# Patient Record
Sex: Female | Born: 1965
Health system: Southern US, Community
[De-identification: ages and names within clinical notes are randomized; demographics above are authoritative.]

## PROBLEM LIST (undated history)

## (undated) DIAGNOSIS — R011 Cardiac murmur, unspecified: Secondary | ICD-10-CM

## (undated) DIAGNOSIS — E876 Hypokalemia: Secondary | ICD-10-CM

## (undated) DIAGNOSIS — E559 Vitamin D deficiency, unspecified: Secondary | ICD-10-CM

## (undated) DIAGNOSIS — E7800 Pure hypercholesterolemia, unspecified: Secondary | ICD-10-CM

## (undated) DIAGNOSIS — I10 Essential (primary) hypertension: Secondary | ICD-10-CM

## (undated) HISTORY — DX: Hypokalemia: E87.6

## (undated) HISTORY — DX: Essential (primary) hypertension: I10

## (undated) HISTORY — DX: Pure hypercholesterolemia, unspecified: E78.00

## (undated) HISTORY — DX: Cardiac murmur, unspecified: R01.1

## (undated) HISTORY — DX: Vitamin D deficiency, unspecified: E55.9

---

## 1998-07-03 HISTORY — PX: VAGINAL HYSTERECTOMY: SHX2639

## 2008-07-03 HISTORY — PX: KNEE SURGERY: SHX244

## 2008-07-29 ENCOUNTER — Ambulatory Visit: Payer: Self-pay | Admitting: Diagnostic Radiology

## 2008-07-29 ENCOUNTER — Emergency Department (HOSPITAL_BASED_OUTPATIENT_CLINIC_OR_DEPARTMENT_OTHER): Admission: EM | Admit: 2008-07-29 | Discharge: 2008-07-29 | Payer: Self-pay | Admitting: Emergency Medicine

## 2010-06-20 ENCOUNTER — Emergency Department (HOSPITAL_BASED_OUTPATIENT_CLINIC_OR_DEPARTMENT_OTHER)
Admission: EM | Admit: 2010-06-20 | Discharge: 2010-06-20 | Disposition: A | Discharge status: Other Acute Inpatient Hospital | Payer: Self-pay | Source: Home / Self Care | Admitting: Emergency Medicine

## 2010-06-20 ENCOUNTER — Inpatient Hospital Stay (HOSPITAL_COMMUNITY)
Admission: EM | Admit: 2010-06-20 | Discharge: 2010-06-21 | Payer: Self-pay | Attending: Internal Medicine | Admitting: Internal Medicine

## 2010-06-21 ENCOUNTER — Encounter (INDEPENDENT_AMBULATORY_CARE_PROVIDER_SITE_OTHER): Payer: Self-pay | Admitting: Internal Medicine

## 2010-09-12 LAB — LIPID PANEL
Cholesterol: 187 mg/dL (ref 0–200)
HDL: 50 mg/dL (ref >39)
Total CHOL/HDL Ratio: 3.7 RATIO
VLDL: 16 mg/dL (ref 0–40)

## 2010-09-12 LAB — CARDIAC PANEL(CRET KIN+CKTOT+MB+TROPI)
Relative Index: INVALID (ref 0.0–2.5)
Total CK: 75 U/L (ref 7–177)

## 2010-09-12 LAB — COMPREHENSIVE METABOLIC PANEL
BUN: 8 mg/dL (ref 6–23)
CO2: 27 mEq/L (ref 19–32)
Calcium: 8.5 mg/dL (ref 8.4–10.5)
Creatinine, Ser: 0.8 mg/dL (ref 0.4–1.2)
GFR calc Af Amer: >60 mL/min (ref >60)
GFR calc non Af Amer: >60 mL/min (ref >60)
Glucose, Bld: 95 mg/dL (ref 70–99)
Sodium: 139 mEq/L (ref 135–145)
Total Protein: 6.1 g/dL (ref 6.0–8.3)

## 2010-09-12 LAB — CBC
MCH: 27.5 pg (ref 26.0–34.0)
MCHC: 33.5 g/dL (ref 30.0–36.0)
MCV: 82 fL (ref 78.0–100.0)
MCV: 85.1 fL (ref 78.0–100.0)
Platelets: 312 10*3/uL (ref 150–400)
Platelets: 304 10*3/uL (ref 150–400)
RBC: 4.62 MIL/uL (ref 3.87–5.11)
RBC: 4.3 MIL/uL (ref 3.87–5.11)
RDW: 12.8 % (ref 11.5–15.5)
WBC: 8.2 10*3/uL (ref 4.0–10.5)

## 2010-09-12 LAB — HEMOGLOBIN A1C
Hgb A1c MFr Bld: 6 % — ABNORMAL HIGH (ref <5.7)
Mean Plasma Glucose: 126 mg/dL — ABNORMAL HIGH (ref <117)

## 2010-09-12 LAB — BASIC METABOLIC PANEL
BUN: 13 mg/dL (ref 6–23)
CO2: 27 mEq/L (ref 19–32)
Chloride: 106 mEq/L (ref 96–112)
Creatinine, Ser: 0.8 mg/dL (ref 0.4–1.2)
Glucose, Bld: 95 mg/dL (ref 70–99)

## 2010-09-12 LAB — DIFFERENTIAL
Eosinophils Absolute: 0.1 10*3/uL (ref 0.0–0.7)
Eosinophils Relative: 1 % (ref 0–5)
Lymphs Abs: 3.3 10*3/uL (ref 0.7–4.0)

## 2010-09-12 LAB — ANTI-NUCLEAR AB-TITER (ANA TITER): ANA Titer 1: 1:160 {titer} — ABNORMAL HIGH

## 2010-09-12 LAB — SEDIMENTATION RATE: Sed Rate: 4 mm/hr (ref 0–22)

## 2010-09-12 LAB — PROTIME-INR
INR: 1 (ref 0.00–1.49)
Prothrombin Time: 13.4 seconds (ref 11.6–15.2)

## 2010-10-17 LAB — DIFFERENTIAL
Basophils Absolute: 0 10*3/uL (ref 0.0–0.1)
Lymphocytes Relative: 32 % (ref 12–46)
Lymphs Abs: 2.3 10*3/uL (ref 0.7–4.0)
Monocytes Absolute: 0.4 10*3/uL (ref 0.1–1.0)
Neutro Abs: 4.5 10*3/uL (ref 1.7–7.7)

## 2010-10-17 LAB — BASIC METABOLIC PANEL
Calcium: 9.1 mg/dL (ref 8.4–10.5)
GFR calc Af Amer: >60 mL/min (ref >60)
GFR calc non Af Amer: >60 mL/min (ref >60)
Glucose, Bld: 95 mg/dL (ref 70–99)
Potassium: 3.5 mEq/L (ref 3.5–5.1)
Sodium: 139 mEq/L (ref 135–145)

## 2010-10-17 LAB — CBC
Hemoglobin: 11.7 g/dL — ABNORMAL LOW (ref 12.0–15.0)
RDW: 12.5 % (ref 11.5–15.5)
WBC: 7.4 10*3/uL (ref 4.0–10.5)

## 2010-10-17 LAB — POCT CARDIAC MARKERS
CKMB, poc: <1 ng/mL — ABNORMAL LOW (ref 1.0–8.0)
Myoglobin, poc: >500 ng/mL (ref 12–200)
Troponin i, poc: <0.05 ng/mL (ref 0.00–0.09)

## 2010-10-17 LAB — D-DIMER, QUANTITATIVE: D-Dimer, Quant: 0.59 ug/mL-FEU — ABNORMAL HIGH (ref 0.00–0.48)

## 2012-10-01 ENCOUNTER — Other Ambulatory Visit (HOSPITAL_COMMUNITY): Payer: Self-pay | Admitting: Internal Medicine

## 2012-10-01 ENCOUNTER — Ambulatory Visit (HOSPITAL_COMMUNITY)
Admission: RE | Admit: 2012-10-01 | Discharge: 2012-10-01 | Disposition: A | Discharge status: Home / Self Care | Payer: BC Managed Care – PPO | Source: Ambulatory Visit | Attending: Internal Medicine | Admitting: Internal Medicine

## 2012-10-01 DIAGNOSIS — G459 Transient cerebral ischemic attack, unspecified: Secondary | ICD-10-CM

## 2012-10-01 DIAGNOSIS — R4789 Other speech disturbances: Secondary | ICD-10-CM | POA: Insufficient documentation

## 2012-10-01 DIAGNOSIS — E236 Other disorders of pituitary gland: Secondary | ICD-10-CM | POA: Insufficient documentation

## 2012-10-01 DIAGNOSIS — R5381 Other malaise: Secondary | ICD-10-CM | POA: Insufficient documentation

## 2012-10-01 DIAGNOSIS — R5383 Other fatigue: Secondary | ICD-10-CM | POA: Insufficient documentation

## 2012-10-01 DIAGNOSIS — I10 Essential (primary) hypertension: Secondary | ICD-10-CM | POA: Insufficient documentation

## 2012-10-01 DIAGNOSIS — R209 Unspecified disturbances of skin sensation: Secondary | ICD-10-CM | POA: Insufficient documentation

## 2012-10-01 MED ORDER — GADOBENATE DIMEGLUMINE 529 MG/ML IV SOLN
19.0000 mL | Freq: Once | INTRAVENOUS | Status: AC | PRN
  Administered 2012-10-01: 19 mL via INTRAVENOUS

## 2012-10-11 ENCOUNTER — Encounter: Payer: Self-pay | Admitting: *Deleted

## 2012-10-11 ENCOUNTER — Ambulatory Visit: Payer: Self-pay | Admitting: Diagnostic Neuroimaging

## 2012-10-11 ENCOUNTER — Encounter: Payer: Self-pay | Admitting: Diagnostic Neuroimaging

## 2012-10-11 ENCOUNTER — Ambulatory Visit (INDEPENDENT_AMBULATORY_CARE_PROVIDER_SITE_OTHER): Payer: BC Managed Care – PPO | Admitting: Diagnostic Neuroimaging

## 2012-10-11 VITALS — BP 122/81 | HR 84 | Temp 97.8°F | Ht 67.0 in | Wt 207.0 lb

## 2012-10-11 DIAGNOSIS — I6350 Cerebral infarction due to unspecified occlusion or stenosis of unspecified cerebral artery: Secondary | ICD-10-CM

## 2012-10-11 DIAGNOSIS — I639 Cerebral infarction, unspecified: Secondary | ICD-10-CM | POA: Insufficient documentation

## 2012-10-11 DIAGNOSIS — R200 Anesthesia of skin: Secondary | ICD-10-CM

## 2012-10-11 DIAGNOSIS — R209 Unspecified disturbances of skin sensation: Secondary | ICD-10-CM

## 2012-10-11 NOTE — Patient Instructions (Signed)
Stroke (Cerebrovascular Accident) A stroke (cerebrovascular accident, CVA) means you have a brain injury from blocked circulation or bleeding in the brain. Blocked circulation usually comes from a clot. RISK FACTORS  High blood pressure (hypertension).   High cholesterol.   Diabetes.   Heart disease.   The buildup of fatty deposits in the blood vessels (peripheral artery disease or atherosclerosis).   An abnormal heart rhythm (atrial fibrillation).   Obesity.   Smoking.   Taking oral contraceptives (especially in combination with smoking).   Physical inactivity.   A diet high in fats, salt (sodium), and calories.   Alcohol use.   Use of illegal drugs (especially cocaine and methamphetamine).   Being a female.   Being an African American.   Age over 55.   Family history of stroke.   Previous history of blood clots, a "warning stroke" (transient ischemic attack, TIA), or heart attack.   Sickle cell disease.  SYMPTOMS  The symptoms of a stroke depend on the part of the brain that is affected. It is important to seek treatment within 4 hours of the start of symptoms because you may receive a "clot dissolving" medication that cannot be given after that time. Even if you don't know when your symptoms began, get treatment as soon as possible. Symptoms of a stroke may progress or change over the first several days. Symptoms may include:  Sudden weakness or numbness of the face, arm, or leg, especially on one side of the body.   Sudden confusion.   Trouble speaking (aphasia) or understanding.   Sudden trouble seeing in one or both eyes.   Sudden trouble walking.   Dizziness.   Loss of balance or coordination.   Sudden severe headache with no known cause.  HOME CARE INSTRUCTIONS   Medicines: Aspirin and blood thinners may be used to prevent another stroke. Blood thinners need to be used exactly as instructed. Medicines may also be used to control risk factors for a  stroke. Be sure you understand all your medicine instructions.   Diet: Certain diets may be prescribed to address high blood pressure, high cholesterol, diabetes, or obesity. A diet that includes 5 or more servings of fruits and vegetables a day may reduce the risk of stroke. Foods may need to be a special consistency (soft or pureed), or small bites may need to be taken in order to avoid aspirating or choking.   Maintain a healthy weight.   Stay physically active. It is recommended that you get at least 30 minutes of activity on most or all days.   Do not smoke.   Limit alcohol use.   Stop drug abuse.   Home safety: A safe home environment is important to reduce the risk of falls. Your caregiver may arrange for specialists to evaluate your home. Having grab bars in the bedroom and bathroom is often important. Your caregiver may arrange for special equipment to be used at home, such as raised toilets and a seat for the shower.   Physical, occupational, and speech therapy: Ongoing therapy may be needed to maximize your recovery after a stroke. If you have been advised to use a walker or a cane, use it at all times. Be sure to keep your therapy appointments.   Follow all instructions for follow-up with your caregiver. This is VERY important. This includes any referrals, physical therapy, rehabilitation, and laboratory tests. Proper treatment also prevents another stroke from occurring.  SEEK IMMEDIATE MEDICAL CARE IF:   You have   sudden weakness or numbness of the face, arm, or leg, especially on one side of the body.   You have sudden confusion.   You have trouble speaking (aphasia) or understanding.   You have sudden trouble seeing in one or both eyes.   You have sudden trouble walking.   You have dizziness.   You have loss of balance or coordination.   You have a sudden, severe headache with no known cause.   You have a fever.   You are coughing or have difficulty breathing.    You have new chest pain, angina, or an irregular heartbeat.  Any of these symptoms may represent a serious problem that is an emergency. Do not wait to see if the symptoms will go away. Get medical help at once. Call your local emergency services (911 in [place] Do not drive yourself to the hospital. Document Released: 06/19/2005 Document Revised: 01/02/2011 Document Reviewed: 11/17/2009 ExitCare Patient Information 2012 [employer]. 

## 2012-10-11 NOTE — Progress Notes (Signed)
[facility]  PATIENT: Krystal Jackson DOB: October 31, 1965  REFERRING CLINICIAN: [id number] HISTORY FROM: patient REASON FOR VISIT: new consult   HISTORICAL  CHIEF COMPLAINT:  Chief Complaint  Patient presents with  . Transient Ischemic Attack    HISTORY OF PRESENT ILLNESS:   47 year old right-handed female with hypertension, hypercholesteremia, here for evaluation of hospital TIA.  On 10/01/12 patient had sudden onset of left face and left arm and numbness. She had some weakness as well. Patient went to PCP who ordered MRI of the brain, which was normal. Patient symptoms persisted and slightly worsened on 10/04/12. Patient continues to have some numbness in her left face and left arm. She has intermittent episodes of feeling like her speech is slurred. She has some word finding difficulties. She also has had some left eyelid twitching for the past 2-3 weeks.  REVIEW OF SYSTEMS: Full 14 system review of systems performed and notable only for fatigue, rash trouble swallowing Iincreased thirst allergy runny nose headache numbness weakness.  ALLERGIES: No Known Allergies  HOME MEDICATIONS: No outpatient prescriptions prior to visit.   No facility-administered medications prior to visit.    PAST MEDICAL HISTORY: Past Medical History  Diagnosis Date  . Heart murmur   . Hypokalemia   . Vitamin D deficiency   . Hypertension   . Hypercholesteremia     PAST SURGICAL HISTORY: Past Surgical History  Procedure Laterality Date  . Knee surgery  2010  . Vaginal hysterectomy  2000    FAMILY HISTORY: Family History  Problem Relation Age of Onset  . Other Father   . Colon cancer    . Skin cancer    . Stroke    . High Cholesterol      SOCIAL HISTORY:  History   Social History  . Marital Status: Married    Spouse Name: N/A    Number of Children: 2  . Years of Education: MAED   Occupational History  . Teacher [employer]   Social History Main Topics  . Smoking status: Never Smoker   . Smokeless tobacco: Not on file  . Alcohol Use: No  . Drug Use: No  . Sexually Active: Not on file   Other Topics Concern  . Not on file   Social History Narrative      Pt lives at home with her spouse.   Caffeine Use- 16oz(s) daily     PHYSICAL EXAM  Filed Vitals:   10/11/12 1033  BP: 122/81  Pulse: 84  Temp: 97.8 F (36.6 C)  TempSrc: Oral  Height: 5\' 7"  (1.702 m)  Weight: 207 lb (93.895 kg)   Body mass index is 32.41 kg/(m^2).  GENERAL EXAM: Patient is in no distress  CARDIOVASCULAR: Regular rate and rhythm, no murmurs, no carotid bruits  NEUROLOGIC: MENTAL STATUS: awake, alert, language fluent, comprehension intact, naming intact CRANIAL NERVE: no papilledema on fundoscopic exam, pupils equal and reactive to light, visual fields full to confrontation, extraocular muscles intact, no nystagmus, DECR SENS IN LEFT FACE, NORMAL FACIAL STRENGTH, uvula midline, shoulder shrug symmetric, TONGUE DEVIATES TO THE RIGHT, BUT ABLE TO MOVE TO THE LEFT VOLUNTARILY. MOTOR: normal bulk and tone, full strength in the RUE, BLE; LEFT ARM DELTOID 4+, LEFT TRICEPS 3, LEFT GRIP 4. SENSORY: DECR IN LEFT ARM COORDINATION: finger-nose-finger, fine finger movements normal REFLEXES: deep tendon reflexes present and symmetric GAIT/STATION: narrow based gait; able to walk on toes, heels and tandem; romberg is negative  DIAGNOSTIC DATA (LABS, IMAGING, TESTING) - I reviewed patient records, labs, notes, testing and imaging myself where available.  Lab Results  Component Value Date   WBC 8.2 06/21/2010   HGB 12.1 06/21/2010   HCT 36.6 06/21/2010   MCV 85.1 06/21/2010   PLT 304 06/21/2010      Component Value Date/Time   NA 139 06/21/2010 0450   K 3.1* 06/21/2010 0450   CL 105 06/21/2010 0450   CO2 27 06/21/2010 0450   GLUCOSE 95 06/21/2010 0450   BUN 8 06/21/2010 0450   CREATININE 0.80 06/21/2010  0450   CALCIUM 8.5 06/21/2010 0450   PROT 6.1 06/21/2010 0450   ALBUMIN 3.4* 06/21/2010 0450   AST 15 06/21/2010 0450   ALT 12 06/21/2010 0450   ALKPHOS 80 06/21/2010 0450   BILITOT 0.7 06/21/2010 0450   GFRNONAA >60 06/21/2010 0450   GFRAA  Value: >60        The eGFR has been calculated using the MDRD equation. This calculation has not been validated in all clinical situations. eGFR's persistently <60 mL/min signify possible Chronic Kidney Disease. 06/21/2010 0450   Lab Results  Component Value Date   CHOL  Value: 187        ATP III CLASSIFICATION:  <200     mg/dL   Desirable  981-191  mg/dL   Borderline High  >=478    mg/dL   High        29/56/2130   HDL 50 06/21/2010   LDLCALC  Value: 121        Total Cholesterol/HDL:CHD Risk Coronary Heart Disease Risk Table                     Men   Women  1/2 Average Risk   3.4   3.3  Average Risk       5.0   4.4  2 X Average Risk   9.6   7.1  3 X Average Risk  23.4   11.0        Use the calculated Patient Ratio above and the CHD Risk Table to determine the patient's CHD Risk.        ATP III CLASSIFICATION (LDL):  <100     mg/dL   Optimal  865-784  mg/dL   Near or Above                    Optimal  130-159  mg/dL   Borderline  696-295  mg/dL   High  >284     mg/dL   Very High* 13/24/4010   TRIG 78 06/21/2010   CHOLHDL 3.7 06/21/2010   Lab Results  Component Value Date   HGBA1C  Value: 6.0 (NOTE)                                                                       According to the ADA Clinical Practice Recommendations for 2011, when HbA1c is used as a screening test:   >=6.5%   Diagnostic of Diabetes Mellitus           (if abnormal result  is confirmed)  5.7-6.4%   Increased risk of developing Diabetes Mellitus  References:Diagnosis and Classification of Diabetes Mellitus,Diabetes Care,2011,34(Suppl  1):S62-S69 and Standards of Medical Care in         Diabetes - 2011,Diabetes [phone]  (Suppl 1):S11-S61.* 06/21/2010   No results found for this  basename: [id number]   Lab Results  Component Value Date   TSH 2.866 06/21/2010   10/01/12 MRI brain - no acute findings; normal  ASSESSMENT AND PLAN  47 y.o. year old female  has a past medical history of Heart murmur; Hypokalemia; Vitamin D deficiency; Hypertension; and Hypercholesteremia. here with persistent left face and left arm numbness with left arm weakness. Initial MRI of the brain was negative for acute stroke. However patient's clinical symptoms may suggest MRI negative stroke. Sometimes MRI study done immediately after onset of stroke may be negative in cases of small vessel thrombosis affecting the brain stem particularly. Due to persistence of symptoms with some fluctuation worsening since she had her previous scan, I will repeat MRI of the brain. This time I will also add MRA of the head and neck to evaluate for other causes of stroke. I will order echocardiogram. Patient should continue on aspirin and her blood pressure medication. She's taking Crestor. Stroke risk factors and warning symptoms reviewed with patient.   Orders Placed This Encounter  Procedures  . MR Brain Wo Contrast  . MR MRA HEAD WO CONTRAST  . MR Angiogram Neck W Wo Contrast  . 2D Echocardiogram without contrast    Suanne Marker, MD 10/11/2012, 2:59 PM Certified in Neurology, Neurophysiology and Neuroimaging  [facility] [address] [place], [place] [address] [phone]

## 2012-10-17 ENCOUNTER — Ambulatory Visit (INDEPENDENT_AMBULATORY_CARE_PROVIDER_SITE_OTHER): Payer: BC Managed Care – PPO

## 2012-10-17 ENCOUNTER — Other Ambulatory Visit: Payer: BC Managed Care – PPO

## 2012-10-17 DIAGNOSIS — R209 Unspecified disturbances of skin sensation: Secondary | ICD-10-CM

## 2012-10-17 DIAGNOSIS — I639 Cerebral infarction, unspecified: Secondary | ICD-10-CM

## 2012-10-17 DIAGNOSIS — I6350 Cerebral infarction due to unspecified occlusion or stenosis of unspecified cerebral artery: Secondary | ICD-10-CM

## 2012-10-17 DIAGNOSIS — R200 Anesthesia of skin: Secondary | ICD-10-CM

## 2012-10-17 MED ORDER — GADOPENTETATE DIMEGLUMINE 469.01 MG/ML IV SOLN: 20.0000 mL | Freq: Once | INTRAVENOUS | Status: AC | PRN

## 2012-10-23 ENCOUNTER — Telehealth: Payer: Self-pay | Admitting: *Deleted

## 2012-10-23 NOTE — Telephone Encounter (Signed)
Message copied by Monico Blitz on Wed Oct 23, 2012 12:17 PM ------      Message from: [place], [place] F      Created: Wed Oct 23, 2012 11:41 AM       Patient needs a call back about test. ------

## 2012-10-23 NOTE — Telephone Encounter (Signed)
Patient had a MR Angiogram W Wo which was normal, MR MRA head which was normal,brain scan normal. Once the patient calls back i will relay this once she calls back.

## 2012-10-25 ENCOUNTER — Telehealth: Payer: Self-pay | Admitting: *Deleted

## 2012-10-25 NOTE — Telephone Encounter (Signed)
Called patient and relayed information

## 2012-11-04 ENCOUNTER — Other Ambulatory Visit (HOSPITAL_COMMUNITY): Payer: BC Managed Care – PPO

## 2012-11-08 ENCOUNTER — Ambulatory Visit (HOSPITAL_COMMUNITY): Payer: BC Managed Care – PPO | Attending: Diagnostic Neuroimaging | Admitting: Radiology

## 2012-11-08 ENCOUNTER — Other Ambulatory Visit (HOSPITAL_COMMUNITY): Payer: Self-pay | Admitting: Radiology

## 2012-11-08 DIAGNOSIS — Z8673 Personal history of transient ischemic attack (TIA), and cerebral infarction without residual deficits: Secondary | ICD-10-CM | POA: Insufficient documentation

## 2012-11-08 DIAGNOSIS — R209 Unspecified disturbances of skin sensation: Secondary | ICD-10-CM | POA: Insufficient documentation

## 2012-11-08 DIAGNOSIS — R200 Anesthesia of skin: Secondary | ICD-10-CM

## 2012-11-08 DIAGNOSIS — G459 Transient cerebral ischemic attack, unspecified: Secondary | ICD-10-CM

## 2012-11-08 DIAGNOSIS — I639 Cerebral infarction, unspecified: Secondary | ICD-10-CM

## 2012-11-08 DIAGNOSIS — I6789 Other cerebrovascular disease: Secondary | ICD-10-CM

## 2012-11-08 NOTE — Progress Notes (Signed)
Echocardiogram performed.  

## 2012-11-15 ENCOUNTER — Telehealth: Payer: Self-pay | Admitting: Diagnostic Neuroimaging

## 2012-11-18 NOTE — Telephone Encounter (Signed)
Showing pt called in on 11/15/12 to get Echo results. Per notes results were given on 11/11/12. Called pt to confirm, no answer.

## 2012-12-23 ENCOUNTER — Telehealth: Payer: Self-pay

## 2012-12-23 NOTE — Telephone Encounter (Signed)
PCP requested copy of echo results faxed. Sent info to fax# [phone]

## 2013-03-27 ENCOUNTER — Other Ambulatory Visit (HOSPITAL_BASED_OUTPATIENT_CLINIC_OR_DEPARTMENT_OTHER): Payer: Self-pay | Admitting: Internal Medicine

## 2013-03-27 DIAGNOSIS — R160 Hepatomegaly, not elsewhere classified: Secondary | ICD-10-CM

## 2013-03-27 DIAGNOSIS — R109 Unspecified abdominal pain: Secondary | ICD-10-CM

## 2013-04-09 ENCOUNTER — Ambulatory Visit (HOSPITAL_BASED_OUTPATIENT_CLINIC_OR_DEPARTMENT_OTHER)
Admission: RE | Admit: 2013-04-09 | Discharge: 2013-04-09 | Disposition: A | Discharge status: Home / Self Care | Payer: BC Managed Care – PPO | Source: Ambulatory Visit | Attending: Internal Medicine | Admitting: Internal Medicine

## 2013-04-09 DIAGNOSIS — K7689 Other specified diseases of liver: Secondary | ICD-10-CM | POA: Insufficient documentation

## 2013-04-09 DIAGNOSIS — R1011 Right upper quadrant pain: Secondary | ICD-10-CM | POA: Insufficient documentation

## 2013-04-09 DIAGNOSIS — R109 Unspecified abdominal pain: Secondary | ICD-10-CM

## 2013-04-09 DIAGNOSIS — K8689 Other specified diseases of pancreas: Secondary | ICD-10-CM | POA: Insufficient documentation

## 2013-04-09 DIAGNOSIS — R160 Hepatomegaly, not elsewhere classified: Secondary | ICD-10-CM

## 2013-04-30 ENCOUNTER — Encounter: Payer: Self-pay | Admitting: Diagnostic Neuroimaging

## 2013-04-30 ENCOUNTER — Ambulatory Visit (INDEPENDENT_AMBULATORY_CARE_PROVIDER_SITE_OTHER): Payer: BC Managed Care – PPO | Admitting: Diagnostic Neuroimaging

## 2013-04-30 VITALS — BP 112/63 | HR 85 | Temp 97.7°F | Ht 67.0 in | Wt 216.0 lb

## 2013-04-30 DIAGNOSIS — R200 Anesthesia of skin: Secondary | ICD-10-CM | POA: Insufficient documentation

## 2013-04-30 DIAGNOSIS — R209 Unspecified disturbances of skin sensation: Secondary | ICD-10-CM

## 2013-04-30 NOTE — Progress Notes (Signed)
[facility]  PATIENT: Krystal Jackson DOB: [birth year]  REFERRING CLINICIAN:  HISTORY FROM: patient REASON FOR VISIT: follow up   HISTORICAL  CHIEF COMPLAINT:  Chief Complaint  Patient presents with  . Follow-up    HISTORY OF PRESENT ILLNESS:   UPDATE 04/30/13: Since last visit patient doing well. No recurrent symptoms. Patient told me that she was diagnosed with possible neuropathy in the feet, related to long-standing intermittent bilateral lateral foot numbness and tingling. Patient is taking gabapentin with mild relief. Overall cholesterol and blood pressure under good control. Patient is concerned about continuing on hormone replacement therapy (Femring) and has asked me to contact her OB/GYN to further discuss. Patient also tells me that her sister recently passed away from heart related complications related to a "blood clot in the heart". Patient tells me that she herself has been having intermittent chest pressure sensation and exertional chest pain, and is being worked up by primary care physician currently. She has not seen cardiology.  PRIOR HPI (10/11/12): 47 year old right-handed female with hypertension, hypercholesteremia, here for evaluation of hospital TIA.  On 10/01/12 patient had sudden onset of left face and left arm and numbness. She had some weakness as well. Patient went to PCP who ordered MRI of the brain, which was normal. Patient symptoms persisted and slightly worsened on 10/04/12. Patient continues to have some numbness in her left face and left arm. She has intermittent episodes of feeling like her speech is slurred. She has some word finding difficulties. She also has had some left eyelid twitching for the past 2-3 weeks.  REVIEW OF SYSTEMS: Full 14 system review of systems performed and notable only for chest pain and swelling in legs joint pain joint swelling.   ALLERGIES: Allergies  Allergen Reactions  . Gabapentin Hives     HOME MEDICATIONS: Outpatient Prescriptions Prior to Visit  Medication Sig Dispense Refill  . aspirin EC 325 MG tablet Take 1 tablet by mouth daily.      Marland Kitchen FEMRING 0.1 MG/24HR RING daily.      . hydrochlorothiazide (HYDRODIURIL) 25 MG tablet daily.      Marland Kitchen KLOR-CON 10 10 MEQ tablet Take 2 tablets by mouth daily.      . rosuvastatin (CRESTOR) 10 MG tablet Take 10 mg by mouth daily.      . Vitamin D, Ergocalciferol, (DRISDOL) 50000 UNITS CAPS Take 50,000 Units by mouth 2 (two) times a week.       . diltiazem (DILACOR XR) 120 MG 24 hr capsule Take 1 capsule by mouth every 4 (four) hours as needed.       No facility-administered medications prior to visit.    PAST MEDICAL HISTORY: Past Medical History  Diagnosis Date  . Heart murmur   . Hypokalemia   . Vitamin D deficiency   . Hypertension   . Hypercholesteremia     PAST SURGICAL HISTORY: Past Surgical History  Procedure Laterality Date  . Knee surgery  2010  . Vaginal hysterectomy  2000    FAMILY HISTORY: Family History  Problem Relation Age of Onset  . Other Father   . Colon cancer    . Skin cancer    . Stroke    . High Cholesterol      SOCIAL HISTORY:  History   Social History  . Marital Status: Married    Spouse Name: N/A    Number of Children: 2  . Years of Education: MAED   Occupational History  . Teacher [employer]   Social History Main Topics  . Smoking status: Never Smoker   . Smokeless tobacco: Not on file  . Alcohol Use: No  . Drug Use: No  . Sexual Activity: Not on file   Other Topics Concern  . Not on file   Social History Narrative      Pt lives at home with her spouse.   Caffeine Use- 16oz(s) daily     PHYSICAL EXAM  Filed Vitals:   04/30/13 1448  BP: 112/63  Pulse: 85  Temp: 97.7 F (36.5 C)  TempSrc: Oral  Height: 5\' 7"  (1.702 m)  Weight: 216 lb (97.977 kg)   Body mass index is 33.82 kg/(m^2).  GENERAL EXAM: Patient is in no  distress  CARDIOVASCULAR: Regular rate and rhythm, no murmurs, no carotid bruits  NEUROLOGIC: MENTAL STATUS: awake, alert, language fluent, comprehension intact, naming intact CRANIAL NERVE: pupils equal and reactive to light, visual fields full to confrontation, extraocular muscles intact, no nystagmus, facial sensation and strength symmetric, uvula midline, shoulder shrug symmetric, tongue midline. MOTOR: normal bulk and tone, full strength in the BUE, BLE SENSORY: normal and symmetric to light touch, temperature, vibration COORDINATION: finger-nose-finger, fine finger movements normal REFLEXES: deep tendon reflexes present and symmetric GAIT/STATION: narrow based gait; romberg is negative    DIAGNOSTIC DATA (LABS, IMAGING, TESTING) - I reviewed patient records, labs, notes, testing and imaging myself where available.  Lab Results  Component Value Date   WBC 8.2 06/21/2010   HGB 12.1 06/21/2010   HCT 36.6 06/21/2010   MCV 85.1 06/21/2010   PLT 304 06/21/2010      Component Value Date/Time   NA 139 06/21/2010 0450   K 3.1* 06/21/2010 0450   CL 105 06/21/2010 0450   CO2 27 06/21/2010 0450   GLUCOSE 95 06/21/2010 0450   BUN 8 06/21/2010 0450   CREATININE 0.80 06/21/2010 0450   CALCIUM 8.5 06/21/2010 0450   PROT 6.1 06/21/2010 0450   ALBUMIN 3.4* 06/21/2010 0450   AST 15 06/21/2010 0450   ALT 12 06/21/2010 0450   ALKPHOS 80 06/21/2010 0450   BILITOT 0.7 06/21/2010 0450   GFRNONAA >60 06/21/2010 0450   GFRAA  Value: >60        The eGFR has been calculated using the MDRD equation. This calculation has not been validated in all clinical situations. eGFR's persistently <60 mL/min signify possible Chronic Kidney Disease. 06/21/2010 0450   Lab Results  Component Value Date   CHOL  Value: 187        ATP III CLASSIFICATION:  <200     mg/dL   Desirable  782-956  mg/dL   Borderline High  >=213    mg/dL   High        08/65/7846   HDL 50 06/21/2010   LDLCALC  Value: 121         Total Cholesterol/HDL:CHD Risk Coronary Heart Disease Risk Table                     Men   Women  1/2 Average Risk   3.4   3.3  Average Risk       5.0   4.4  2 X Average Risk   9.6   7.1  3 X Average Risk  23.4   11.0        Use the calculated Patient Ratio above and the CHD Risk Table to determine the patient's CHD Risk.  ATP III CLASSIFICATION (LDL):  <100     mg/dL   Optimal  161-096  mg/dL   Near or Above                    Optimal  130-159  mg/dL   Borderline  045-409  mg/dL   High  >811     mg/dL   Very High* 91/47/8295   TRIG 78 06/21/2010   CHOLHDL 3.7 06/21/2010   Lab Results  Component Value Date   HGBA1C  Value: 6.0 (NOTE)                                                                       According to the ADA Clinical Practice Recommendations for 2011, when HbA1c is used as a screening test:   >=6.5%   Diagnostic of Diabetes Mellitus           (if abnormal result  is confirmed)  5.7-6.4%   Increased risk of developing Diabetes Mellitus  References:Diagnosis and Classification of Diabetes Mellitus,Diabetes Care,2011,34(Suppl 1):S62-S69 and Standards of Medical Care in         Diabetes - 2011,Diabetes Care,2011,34  (Suppl 1):S11-S61.* 06/21/2010   No results found for this basename: [id number]   Lab Results  Component Value Date   TSH 2.866 06/21/2010   10/01/12 MRI brain - no acute findings; normal  ASSESSMENT AND PLAN  47 y.o. year old female  has a past medical history of Heart murmur; Hypokalemia; Vitamin D deficiency; Hypertension; and Hypercholesteremia. here with episode of persistent left face and left arm numbness with left arm weakness. Initial MRI of the brain was negative for acute stroke. Follow up MRI brain, MRA head/neck normal.  It is unclear if patient truly had a TIA or MRI negative stroke.  Ddx: TIA, MRI negative stroke, stress reaction, complicated acephalgic migraine  PLAN: 1. Continue aspirin, blood pressure control, crestor 2. I called Dr. Jethro Poling  (OB/GYN) about her hormone replacement. I think it is reasonable for patient to continue on estrogen replacement therapy, recognizing that even though there may be a slight increased risk for stroke, in balance with the patient's severe hormone deficiency symptoms, it is probably worthwhile for patient to continue on her current therapy (Femring).  Return in about 1 year (around 04/30/2014) for with Heide Guile or Eisa Necaise.   Suanne Marker, MD 04/30/2013, 3:44 PM Certified in Neurology, Neurophysiology and Neuroimaging  [facility] [address], [address] [place], [place] [address] [phone]

## 2013-05-08 ENCOUNTER — Other Ambulatory Visit: Payer: Self-pay

## 2013-07-18 ENCOUNTER — Ambulatory Visit (INDEPENDENT_AMBULATORY_CARE_PROVIDER_SITE_OTHER): Payer: BC Managed Care – PPO | Admitting: Neurology

## 2013-07-18 ENCOUNTER — Encounter: Payer: Self-pay | Admitting: Neurology

## 2013-07-18 VITALS — BP 120/80 | HR 72 | Ht 67.5 in | Wt 216.0 lb

## 2013-07-18 DIAGNOSIS — R0902 Hypoxemia: Secondary | ICD-10-CM

## 2013-07-18 DIAGNOSIS — G4761 Periodic limb movement disorder: Secondary | ICD-10-CM

## 2013-07-18 DIAGNOSIS — G2581 Restless legs syndrome: Secondary | ICD-10-CM

## 2013-07-18 DIAGNOSIS — G4733 Obstructive sleep apnea (adult) (pediatric): Secondary | ICD-10-CM

## 2013-07-18 NOTE — Progress Notes (Signed)
Subjective:    Patient ID: Krystal Jackson is a 48 y.o. female.  HPI  Huston Foley, MD, PhD [facility] [address] [address] [place], [place] [address]   Dear Krystal Jackson,   I saw your patient, Krystal Jackson, upon your kind request in my neurologic clinic today for initial consultation of her sleep disorder, concern for obstructive sleep apnea in the context of her recent abnormal overnight pulse oximetry results. The patient is unaccompanied today. As you know, Krystal Jackson, is a very pleasant 48 year old right-handed woman with an underlying medical history of hyperlipidemia, hyperglycemia, atypical chest pain palpitations, and history of TIA for which she has recently seen Dr. Marjory Lies in our office in 10/14 and workup for TIA in April of 2014, who had a nocturnal oximetry test on 06/11/2013 which showed a low oxygen nadir of 75% and 48 minutes below the saturation of 88%. Given her obesity there is concerned that she may have underlying obstructive sleep apnea. She does endorse daytime somnolence. She had cardiac workup including event monitor, stress test and echocardiogram which were reassuring. She has a FHx of heart disease and her sister recently died from heart failure at the age of 40 and her father died at the age of 59 from a PE.  She has been having palpitations for the past few years, but this got worse in October 2014, but she also had a lot of stress at the time, in particular with her sister's health decline and demise. She has palpitations often at night, and she has woken herself up from sleep with heart pounding and irregular pounding.   Her typical bedtime is reported to be around 11 PM and usual wake time is around 8:30 AM. Sleep onset typically occurs within 30 minutes to 2-3 hours at times. She reports feeling poorly rested upon awakening. She wakes up on an average 2 times in the middle of the night and has to go to the bathroom  2 times on a typical night. She admits to frequent morning headaches.  She works full time as a Runner, broadcasting/film/video at [employer], teaching History.  She reports excessive daytime somnolence (EDS) and Her Epworth Sleepiness Score (ESS) is 10/24 today. She has not fallen asleep while driving. The patient has not been taking a planned nap during the work week, but likes to take long nap on weekends.  She has been known to snore for the past few years. Snoring is reportedly marked, and associated with choking sounds and witnessed apneas. The patient admits to a sense of choking or strangling feeling and has woken up with gasping. There is report of nighttime reflux, with no nighttime cough experienced. The patient has noted any RLS symptoms and is known to kick while asleep or before falling asleep. There is no family history of RLS or OSA.  She is a restless sleeper and in the morning, the bed is quite disheveled.   She denies cataplexy, sleep paralysis, hypnagogic or hypnopompic hallucinations, or sleep attacks. She does not report any vivid dreams, nightmares, dream enactments, or parasomnias, such as sleep talking or sleep walking. The patient has not had a sleep study or a home sleep test.  She consumes 3 caffeinated beverages per day, usually in the form of tea.  Her bedroom is usually dark and cool. There is a TV in the bedroom and usually it is not on at night.   Her Past Medical History Is Significant For: Past Medical History  Diagnosis Date  .  Heart murmur   . Hypokalemia   . Vitamin D deficiency   . Hypertension   . Hypercholesteremia     Her Past Surgical History Is Significant For: Past Surgical History  Procedure Laterality Date  . Knee surgery  2010  . Vaginal hysterectomy  2000    Her Family History Is Significant For: Family History  Problem Relation Age of Onset  . Other Father   . Colon cancer    . Skin cancer    . Stroke    . High Cholesterol      Her Social History Is Significant  For: History   Social History  . Marital Status: Married    Spouse Name: Krystal Jackson    Number of Children: 2  . Years of Education: MAED   Occupational History  . 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 Smoking status: Never Smoker   . Smokeless tobacco: Never Used  . Alcohol Use: No  . Drug Use: No  . Sexual Activity: None   Other Topics Concern  . None   Social History Narrative      Pt lives at home with her spouse Krystal Jackson) and her daughter.   Patient has two children.   Caffeine Use- 16oz(s) daily   Patient has a Masters degree.   Patient is right- handed.   Patient drinks 16 oz. Of caffeine daily.    Her Allergies Are:  Allergies  Allergen Reactions  . Gabapentin Hives  :   Her Current Medications Are:  Outpatient Encounter Prescriptions as of 07/18/2013  Medication Sig  . aspirin EC 325 MG tablet Take 1 tablet by mouth daily.  Marland Kitchen FEMRING 0.1 MG/24HR RING daily.  . hydrochlorothiazide (HYDRODIURIL) 25 MG tablet daily.  Marland Kitchen KLOR-CON 10 10 MEQ tablet Take 2 tablets by mouth daily.  . naproxen (NAPROSYN) 500 MG tablet Take 1 tablet by mouth as needed.  . rosuvastatin (CRESTOR) 10 MG tablet Take 10 mg by mouth daily.  . Vitamin D, Ergocalciferol, (DRISDOL) 50000 UNITS CAPS Take 50,000 Units by mouth 2 (two) times a week.   . diltiazem (DILACOR XR) 120 MG 24 hr capsule Take 1 capsule by mouth every 4 (four) hours as needed.   Review of Systems:  Out of a complete 14 point review of systems, all are reviewed and negative with the exception of these symptoms as listed below: Review of Systems  Constitutional: Positive for fatigue.  Eyes: Negative.   Respiratory: Negative.   Cardiovascular: Positive for palpitations and leg swelling.  Gastrointestinal: Negative.   Endocrine: Negative.   Genitourinary: Negative.   Musculoskeletal: Positive for arthralgias.  Skin: Negative.   Allergic/Immunologic:  Negative.   Neurological: Positive for headaches.  Hematological: Negative.   Psychiatric/Behavioral: Positive for sleep disturbance (snoring).    Objective:  Neurologic Exam  Physical Exam Physical Examination:   Filed Vitals:   07/18/13 0913  BP: 120/80  Pulse: 72   General Examination: The patient is a very pleasant 48 y.o. female in no acute distress. She appears well-developed and well-nourished and well groomed.   HEENT: Normocephalic, atraumatic, pupils are equal, round and reactive to light and accommodation. Funduscopic exam is normal with sharp disc margins noted. Extraocular tracking is good without limitation to gaze excursion or nystagmus noted. Normal smooth pursuit is noted. Hearing is grossly intact. Tympanic membranes are clear bilaterally. Face is symmetric with normal facial animation and normal facial  sensation. Speech is clear with no dysarthria noted. There is no hypophonia. There is no lip, neck/head, jaw or voice tremor. Neck is supple with full range of passive and active motion. There are no carotid bruits on auscultation. Oropharynx exam reveals: mild mouth dryness, good dental hygiene and moderate airway crowding, due to larger tongue and large and elongated uvula. Mallampati is class II. Tongue protrudes centrally and palate elevates symmetrically. Tonsils are 1+ in size. Neck size is 16.5 inches. She has a tiny overbite. Nasal inspection reveals no significant nasal mucosal bogginess, but redness is noted, and no septal deviation.     Chest: Clear to auscultation without wheezing, rhonchi or crackles noted.  Heart: S1+S2+0, regular and normal without murmurs, rubs or gallops noted.   Abdomen: Soft, non-tender and non-distended with normal bowel sounds appreciated on auscultation.  Extremities: There is no pitting edema in the distal lower extremities bilaterally. Pedal pulses are intact.  Skin: Warm and dry without trophic changes noted. There are no  varicose veins.  Musculoskeletal: exam reveals no obvious joint deformities, tenderness or joint swelling or erythema.   Neurologically:  Mental status: The patient is awake, alert and oriented in all 4 spheres. Her memory, attention, language and knowledge are appropriate. There is no aphasia, agnosia, apraxia or anomia. Speech is clear with normal prosody and enunciation. Thought process is linear. Mood is congruent and affect is normal.  Cranial nerves are as described above under HEENT exam. In addition, shoulder shrug is normal with equal shoulder height noted. Motor exam: Normal bulk, strength and tone is noted. There is no drift, tremor or rebound. Romberg is negative. Reflexes are 2+ throughout. Toes are downgoing bilaterally. Fine motor skills are intact with normal finger taps, normal hand movements, normal rapid alternating patting, normal foot taps and normal foot agility.  Cerebellar testing shows no dysmetria or intention tremor on finger to nose testing. Heel to shin is unremarkable bilaterally. There is no truncal or gait ataxia.  Sensory exam is intact to light touch, pinprick, vibration, temperature sense in the upper and lower extremities.  Gait, station and balance are unremarkable. No veering to one side is noted. No leaning to one side is noted. Posture is age-appropriate and stance is narrow based. No problems turning are noted. She turns en bloc. Tandem walk is unremarkable. Intact toe and heel stance is noted.               Assessment and Plan:   In summary, Krystal Jackson is a very pleasant 48 y.o.-year old female with a history and physical exam concerning for obstructive sleep apnea (OSA). I had a long chat with the patient about my findings and the diagnosis, its prognosis and treatment options. We talked about medical treatments and non-pharmacological approaches. I explained in particular the risks and ramifications of untreated moderate to severe OSA, especially  with respect to developing cardiovascular disease down the Road, including congestive heart failure, difficult to treat hypertension, cardiac arrhythmias, or stroke. Even type 2 diabetes has in part been linked to untreated OSA. We talked about trying to maintain a healthy lifestyle in general, as well as the importance of weight control. I encouraged the patient to eat healthy, exercise daily and keep well hydrated, to keep a scheduled bedtime and wake time routine, to not skip any meals and eat healthy snacks in between meals.  I recommended the following at this time: sleep study with potential positive airway pressure titration.  I explained the sleep test  procedure to the patient and also outlined possible surgical and non-surgical treatment options of OSA, including the use of a custom-made dental device, upper airway surgical options, such as pillar implants, radiofrequency surgery, tongue base surgery, and UPPP. I also explained the CPAP treatment option to the patient, who indicated that she would be willing to try CPAP if the need arises. I explained the importance of being compliant with PAP treatment, not only for insurance purposes but primarily to improve Her symptoms, and for the patient's long term health benefit, including to reduce Her cardiovascular risks. I answered all her questions today and the patient were in agreement. I would like to see her back after the sleep study is completed and encouraged her to call with any interim questions, concerns, problems or updates.   Thank you very much for allowing me to participate in the care of this nice patient. If I can be of any further assistance to you please do not hesitate to call me at [phone].  Sincerely,   Huston FoleySaima Younes Degeorge, MD, PhD

## 2013-07-18 NOTE — Patient Instructions (Signed)

## 2013-08-01 ENCOUNTER — Ambulatory Visit (INDEPENDENT_AMBULATORY_CARE_PROVIDER_SITE_OTHER): Payer: BC Managed Care – PPO

## 2013-08-01 DIAGNOSIS — R0902 Hypoxemia: Secondary | ICD-10-CM

## 2013-08-01 DIAGNOSIS — G4733 Obstructive sleep apnea (adult) (pediatric): Secondary | ICD-10-CM

## 2013-08-01 DIAGNOSIS — G4761 Periodic limb movement disorder: Secondary | ICD-10-CM

## 2013-08-01 DIAGNOSIS — G4720 Circadian rhythm sleep disorder, unspecified type: Secondary | ICD-10-CM

## 2013-08-01 DIAGNOSIS — G2581 Restless legs syndrome: Secondary | ICD-10-CM

## 2013-08-11 ENCOUNTER — Telehealth: Payer: Self-pay | Admitting: Neurology

## 2013-08-11 NOTE — Telephone Encounter (Signed)
Pt is calling to request a copy of her sleep study results and a follow up appt if applicable to the recommendations from the report.

## 2013-08-12 NOTE — Telephone Encounter (Signed)
I called and spoke with the patient to inform her that her recent sleep study has not been read and that Dr. Frances FurbishAthar will her study on Wednesday afternoon.

## 2013-08-13 ENCOUNTER — Encounter: Payer: Self-pay | Admitting: *Deleted

## 2013-08-13 ENCOUNTER — Telehealth: Payer: Self-pay | Admitting: Neurology

## 2013-08-13 NOTE — Telephone Encounter (Signed)
Please call and notify the patient that the recent sleep study did not show any significant obstructive sleep apnea. Please inform patient that I would like to go over the details of the study during a follow up appointment and if not already previously scheduled, arrange a followup appointment (please utilize a followu-up slot). Also, route or fax report to PCP and referring MD, if other than PCP.  Once you have spoken to patient, you can close this encounter.   Thanks,  Naava Janeway, MD, PhD [facility]  

## 2013-08-13 NOTE — Telephone Encounter (Signed)
I called and spoke with the patient about her recent sleep study results. I informed the patient that the study revealed no significant obstructive sleep apnea. I informed the patient that Dr. Frances FurbishAthar would like to discuss her results in detail. Patient is scheduled for a follow up visit on March 3,2015 at 10:30 with an arrival time of 10:15. I will fax Dr. Jacinto HalimGanji and Dr. Julio Sickssei-Bonsu offices. I will mail a copy of the report along with an appointment reminder letter to the patient.

## 2013-09-02 ENCOUNTER — Encounter: Payer: Self-pay | Admitting: Neurology

## 2013-09-02 ENCOUNTER — Ambulatory Visit (INDEPENDENT_AMBULATORY_CARE_PROVIDER_SITE_OTHER): Payer: BC Managed Care – PPO | Admitting: Neurology

## 2013-09-02 VITALS — BP 137/74 | HR 113 | Temp 98.1°F | Ht 67.5 in

## 2013-09-02 DIAGNOSIS — R002 Palpitations: Secondary | ICD-10-CM

## 2013-09-02 DIAGNOSIS — G4761 Periodic limb movement disorder: Secondary | ICD-10-CM

## 2013-09-02 NOTE — Patient Instructions (Addendum)
Your sleep study showed really reassuring findings. I can see you back as needed.

## 2013-09-02 NOTE — Progress Notes (Signed)
Subjective:    Patient ID: Krystal Jackson is a 48 y.o. female.  HPI    Interim history:   Krystal Jackson, is a very pleasant 48 year old right-handed woman with an underlying medical history of hyperlipidemia, hyperglycemia, atypical chest pain palpitations, and history of TIA (saw Dr. Leta Baptist in our office in 10/14 and workup for TIA in April 2014), who presents for followup consultation of her sleep disorder, after her recent baseline sleep study. She is accompanied by her husband today. I first met her on 07/18/2013 at which time she was referred by her cardiologist. She had had a abnormal overnight pulse oximetry test on 06/11/2013, which showed a low oxygen nadir of 75% and 48 minutes below the saturation of 88%. In light of her obesity and her daytime somnolence I suggested she come back for sleep study. She had baseline sleep test on 08/01/2013 and explained the test findings to her in detail today. Sleep efficiency was normal at 93.4%. Sleep latency was low normal at 1.5 minutes. Her wake after sleep onset was 20.5 minutes with mild sleep fragmentation noted. She had a normal arousal index. She had an increased percentage of stage II sleep, an increased percentage of deep sleep, and a decreased percentage of REM sleep with a mildly prolonged REM latency. She had moderate periodic leg movements of sleep at 35.4 per hour with a low associated arousal index of 0.9 per hour. She had no significant cardiac arrhythmias. She had mild intermittent snoring. Her overall AHI was 0.4 per hour. Baseline oxygen saturation was 94% with a nadir of 90%. She indicated that she slept well during that night.   Today, she reports that she still has occasional palpitations. She has been switched to Lasix from HCTZ. She had LE edema. She has no new complaints and no significant RLS Sx at this time. She has cut out TV from bedroom, which has helped. She has lowered her caffeine intake.   Previous cardiac  workup included event monitor (only few days, d/t reaction to the adhesive electrodes), stress test and echocardiogram, all of which were reassuring. She has a FHx of heart disease and her sister recently died from heart failure at the age of 28 and her father died at the age of 64 from a PE. She has been having palpitations for the past few years, but this got worse in October 2014, but she also had a lot of stress at the time, in particular with her sister's health decline and demise. She has palpitations often at night, and she has woken herself up from sleep with heart pounding and irregular pounding.   Her Past Medical History Is Significant For: Past Medical History  Diagnosis Date  . Heart murmur   . Hypokalemia   . Vitamin D deficiency   . Hypertension   . Hypercholesteremia    Her Past Surgical History Is Significant For: Past Surgical History  Procedure Laterality Date  . Knee surgery  2010  . Vaginal hysterectomy  2000    Her Family History Is Significant For: Family History  Problem Relation Age of Onset  . Other Father   . Colon cancer    . Skin cancer    . Stroke    . High Cholesterol      Her Social History Is Significant For: History   Social History  . Marital Status: Married    Spouse Name: [name]    Number of Children: 2  . Years of Education: MAED   Occupational  History  . Teacher [employer]  . [employer]   Social History Main Topics  . Smoking status: Never Smoker   . Smokeless tobacco: Never Used  . Alcohol Use: No  . Drug Use: No  . Sexual Activity: None   Other Topics Concern  . None   Social History Narrative      Pt lives at home with her spouse [name]) and her daughter.   Patient has two children.   Caffeine Use- 16oz(s) daily   Patient has a Masters degree.   Patient is right- handed.   Patient drinks 16 oz. Of caffeine daily.    Her Allergies Are:  Allergies  Allergen Reactions  .  Gabapentin Hives  :   Her Current Medications Are:  Outpatient Encounter Prescriptions as of 09/02/2013  Medication Sig  . aspirin EC 325 MG tablet Take 1 tablet by mouth daily.  Marland Kitchen FEMRING 0.1 MG/24HR RING daily.  . furosemide (LASIX) 40 MG tablet Take 1 tablet by mouth daily.  . hydrochlorothiazide (HYDRODIURIL) 25 MG tablet daily.  Marland Kitchen KLOR-CON 10 10 MEQ tablet Take 2 tablets by mouth daily.  . naproxen (NAPROSYN) 500 MG tablet Take 1 tablet by mouth as needed.  . rosuvastatin (CRESTOR) 10 MG tablet Take 10 mg by mouth daily.  . Vitamin D, Ergocalciferol, (DRISDOL) 50000 UNITS CAPS Take 50,000 Units by mouth 2 (two) times a week.   . diltiazem (DILACOR XR) 120 MG 24 hr capsule Take 1 capsule by mouth every 4 (four) hours as needed.   Review of Systems:  Out of a complete 14 point review of systems, all are reviewed and negative with the exception of these symptoms as listed below:   Review of Systems  Constitutional: Negative.   HENT: Negative.   Eyes: Negative.   Respiratory: Negative.   Cardiovascular: Negative.   Gastrointestinal: Negative.   Endocrine: Negative.   Genitourinary: Negative.   Musculoskeletal: Negative.   Skin: Negative.   Allergic/Immunologic: Negative.   Neurological: Negative.   Hematological: Negative.   Psychiatric/Behavioral: Positive for sleep disturbance (snoring, restless leg).    Objective:  Neurologic Exam  Physical Exam Physical Examination:   Filed Vitals:   09/02/13 1035  BP: 137/74  Pulse: 113  Temp: 98.1 F (36.7 C)   General Examination: The patient is a very pleasant 48 y.o. female in no acute distress. She appears well-developed and well-nourished and well groomed.   HEENT: Normocephalic, atraumatic, pupils are equal, round and reactive to light and accommodation. Funduscopic exam is normal with sharp disc margins noted. Extraocular tracking is good without limitation to gaze excursion or nystagmus noted. Normal smooth pursuit is  noted. Hearing is grossly intact. Tympanic membranes are clear bilaterally. Face is symmetric with normal facial animation and normal facial sensation. Speech is clear with no dysarthria noted. There is no hypophonia. There is no lip, neck/head, jaw or voice tremor. Neck is supple with full range of passive and active motion. There are no carotid bruits on auscultation. Oropharynx exam reveals: mild mouth dryness, good dental hygiene and moderate airway crowding, due to larger tongue and large and elongated uvula. Mallampati is class II. Tongue protrudes centrally and palate elevates symmetrically. Tonsils are 1+ in size. She has a tiny overbite. Nasal inspection reveals no significant nasal mucosal bogginess, but redness is noted, and no septal deviation.   Chest: Clear to auscultation without wheezing, rhonchi or crackles noted.  Heart: S1+S2+0, regular and normal  without murmurs, rubs or gallops noted.   Abdomen: Soft, non-tender and non-distended with normal bowel sounds appreciated on auscultation.  Extremities: There is no pitting edema in the distal lower extremities bilaterally. Pedal pulses are intact.  Skin: Warm and dry without trophic changes noted. There are no varicose veins.  Musculoskeletal: exam reveals no obvious joint deformities, tenderness or joint swelling or erythema.   Neurologically:  Mental status: The patient is awake, alert and oriented in all 4 spheres. Her memory, attention, language and knowledge are appropriate. There is no aphasia, agnosia, apraxia or anomia. Speech is clear with normal prosody and enunciation. Thought process is linear. Mood is congruent and affect is normal.  Cranial nerves are as described above under HEENT exam. In addition, shoulder shrug is normal with equal shoulder height noted. Motor exam: Normal bulk, strength and tone is noted. There is no drift, tremor or rebound. Romberg is negative. Reflexes are 2+ throughout. Toes are downgoing  bilaterally. Fine motor skills are intact with normal finger taps, normal hand movements, normal rapid alternating patting, normal foot taps and normal foot agility.  Cerebellar testing shows no dysmetria or intention tremor on finger to nose testing. Heel to shin is unremarkable bilaterally. There is no truncal or gait ataxia.  Sensory exam is intact to light touch in the upper and lower extremities.  Gait, station and balance are unremarkable. No veering to one side is noted. No leaning to one side is noted. Posture is age-appropriate and stance is narrow based. No problems turning are noted. She turns en bloc. Tandem walk is unremarkable. Intact toe and heel stance is noted.               Assessment and Plan:   In summary, Krystal Jackson is a very pleasant 48 year old female who presents for followup consultation after a recent sleep study. She had complaints of snoring, intermittent palpitations, intermittent chest pain, and had previously undergone a pulse oximetry test at home with abnormal findings. Her physical exam continues to be nonfocal. She had a sleep study in our sleep lab on 08/01/2013 and I discussed her sleep test findings in detail with her and her husband today. She has thankfully very reassuring findings. She slept well with a normal sleep efficiency. She had a fairly good sleep architecture. She had no significant snoring and no significant sleep disorder breathing. Her oxygen stayed above 90% all night. She had evidence of periodic leg movements but very little arousals and currently does not endorse much in the way of restless leg symptoms. I explained to her that the mere presence of PLMS does not more and treatment unless she feels disturbed by her leg movements or by restless leg symptoms. At this juncture, I reassured her that her cardiac workup so far has been benign and her sleep study has been benign and does not really explain her palpitations and chest pain. I think she  has made some positive changes to help her sleep hygiene and I commended her for that. Unfortunately, she was not able to do the cardiac monitor for more than a few days because she had a reaction to the adhesive. I wonder if she may be a candidate for an implantable loop monitor down the [place]. She can certainly discuss this with her cardiologist. At this point she can followup with me on an as-needed basis. I answered all their questions today and they were in agreement.

## 2013-12-21 IMAGING — US US ABDOMEN COMPLETE
1 series · 14 of 25 positions shown · non-contrast
Comparison: Report October 07, 2009.

CLINICAL DATA: Right upper quadrant abdominal pain.

EXAM:
ULTRASOUND ABDOMEN COMPLETE

[Series 1: us abdomen complete · 0.39mm/px · 14 of 68 slices shown]
[im 1/68]
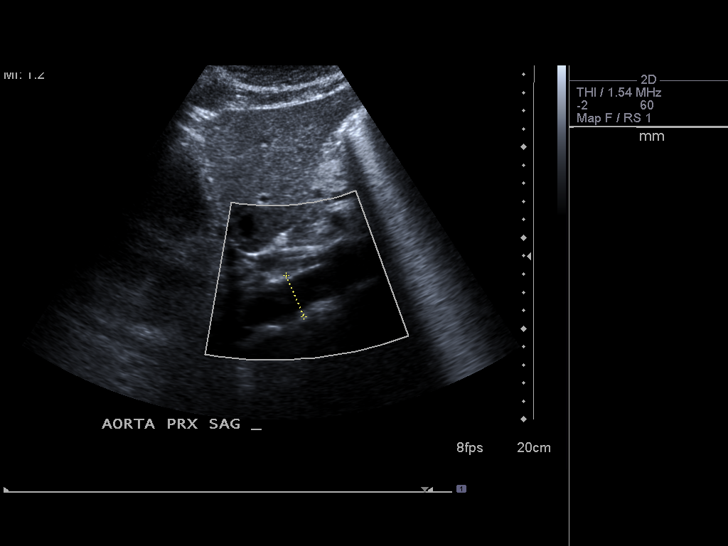
[im 6/68]
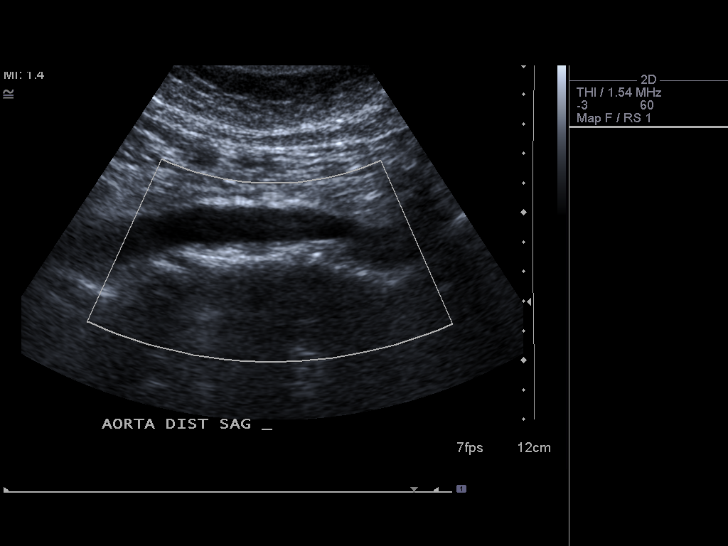
[im 12/68]
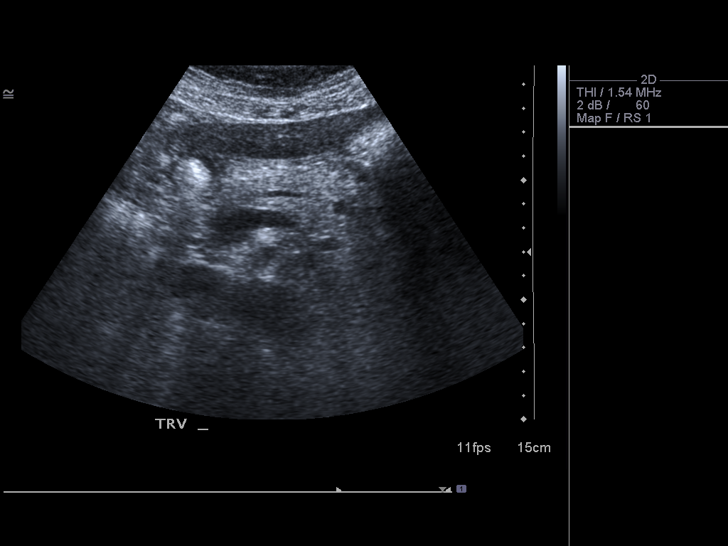
[im 17/68]
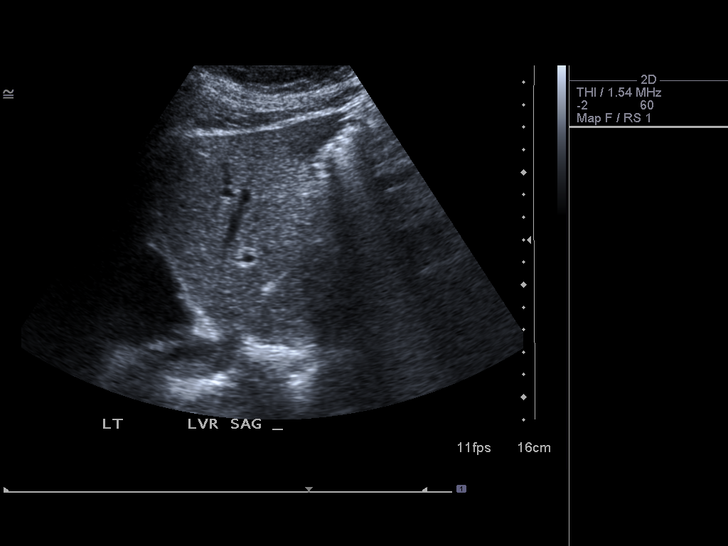
[im 23/68]
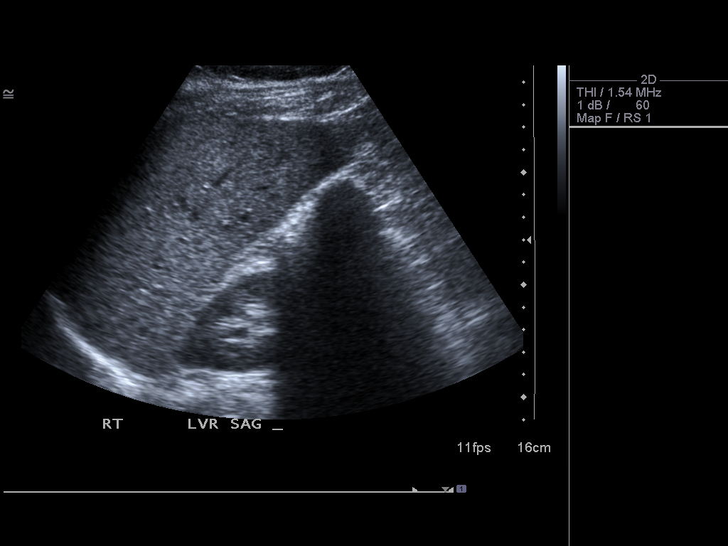
[im 26/68]
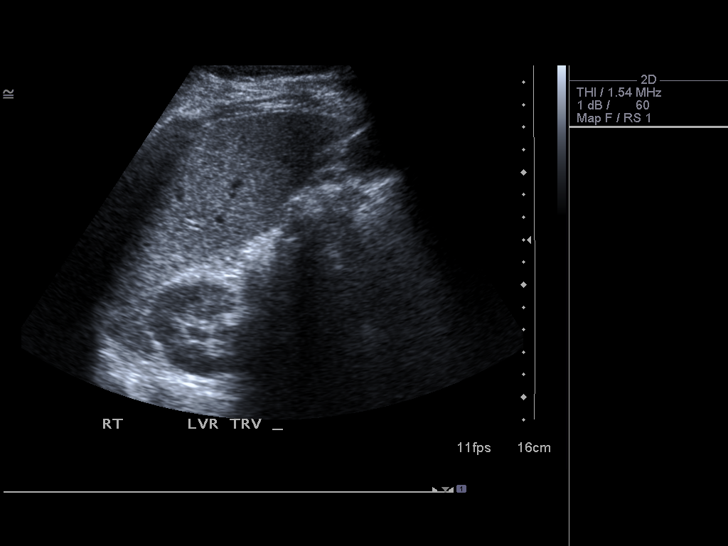
[im 31/68]
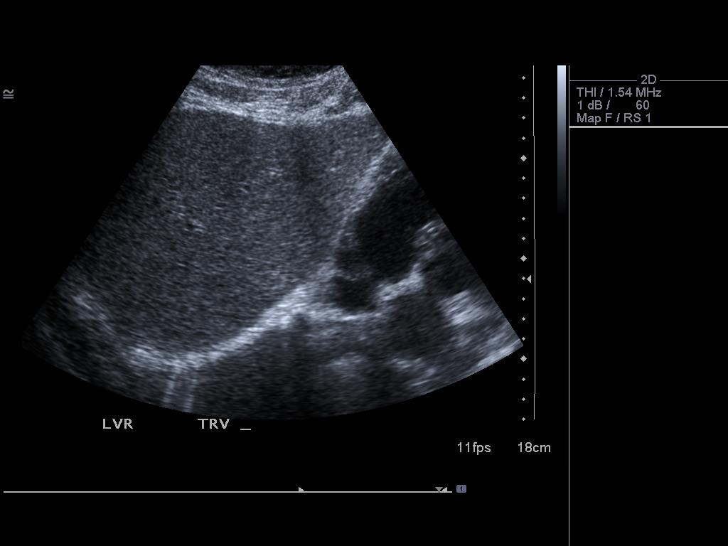
[im 37/68]
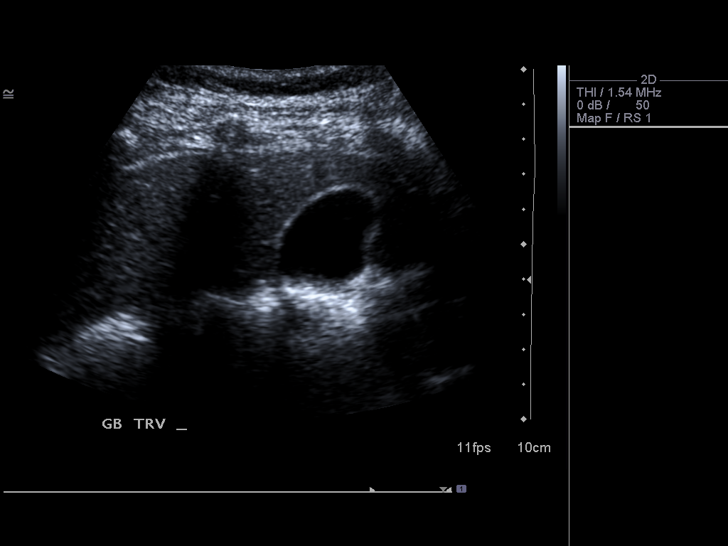
[im 42/68]
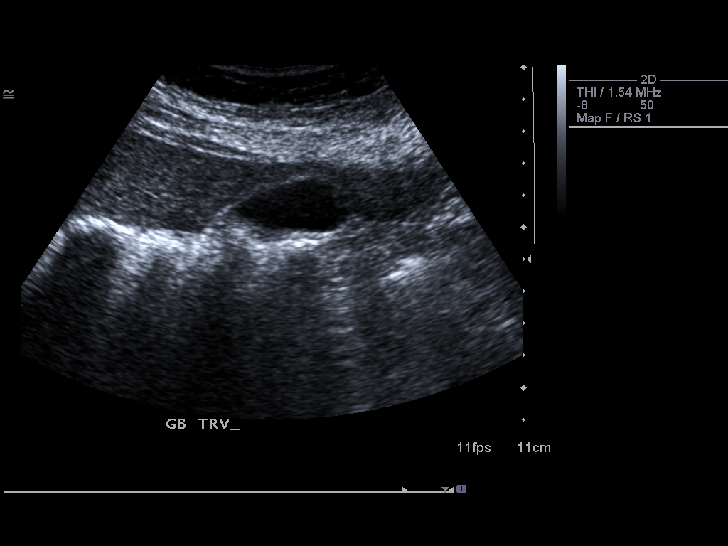
[im 45/68]
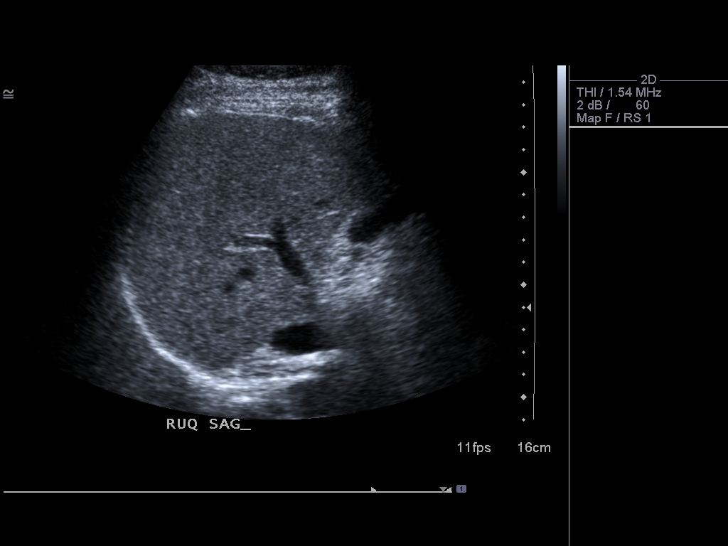
[im 51/68]
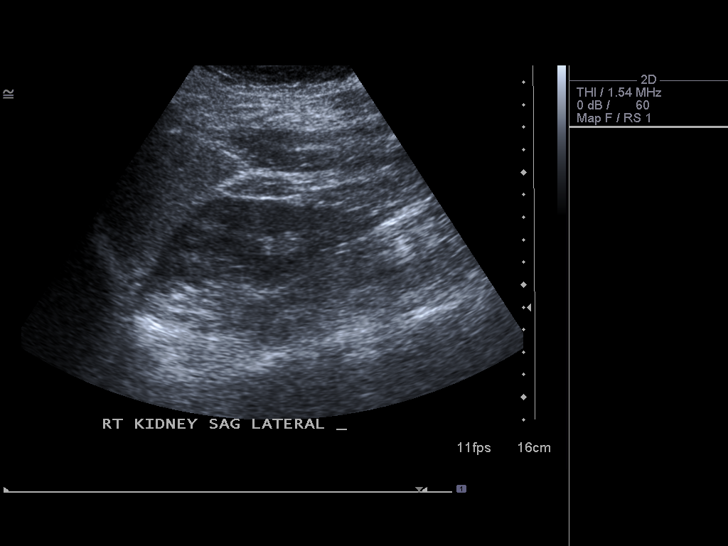
[im 56/68]
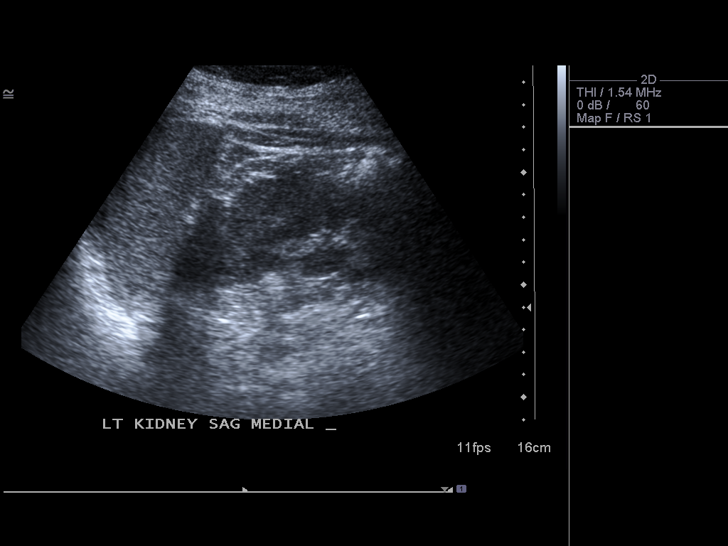
[im 62/68]
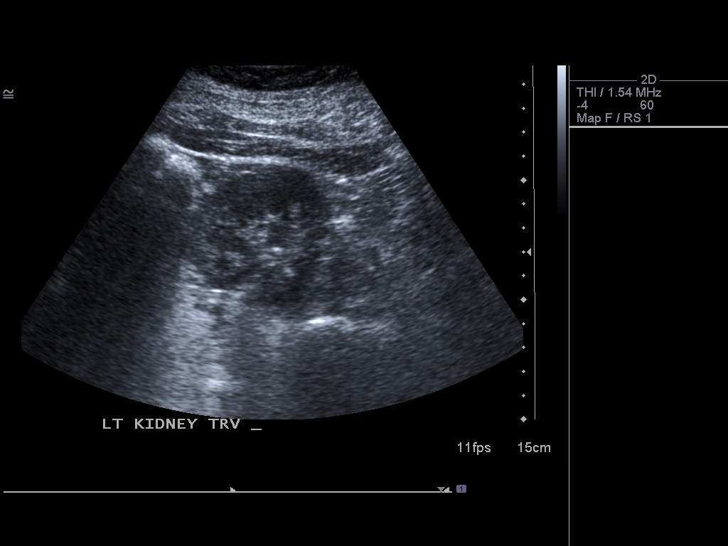
[im 68/68]
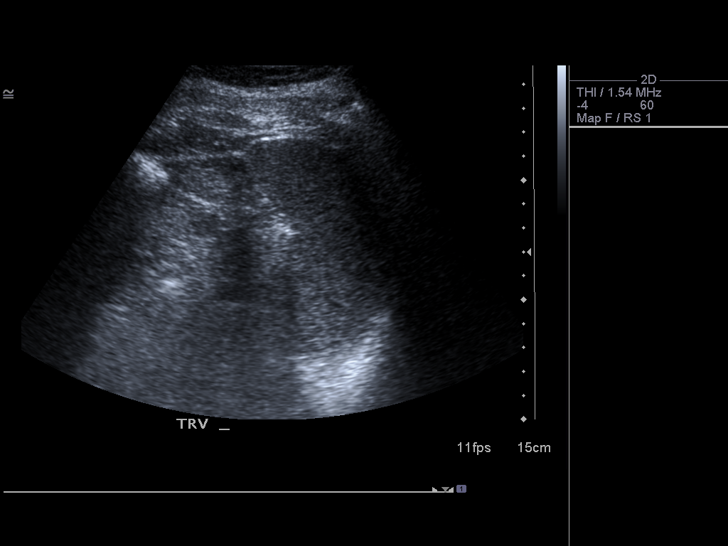

[14 of 25 positions shown; findings below may reference images not displayed]

FINDINGS: Gallbladder

No gallstones or wall thickening. Negative sonographic Murphy's
sign.

Common bile duct

Diameter: Measures 3 mm which is within normal limits.

Liver

No focal lesion identified. Increased echogenicity is noted
suggesting fatty infiltration.

IVC

No abnormality visualized.

Pancreas

Visualized portion of pancreas appears normal except for mildly
dilated pancreatic duct in the body measuring approximately 3 mm.

Spleen

Size and appearance within normal limits.

Right Kidney

Length: Measures 11.1 cm. Echogenicity within normal limits. No mass
or hydronephrosis visualized.

Left Kidney

Length: Measures 11.3 cm. Echogenicity within normal limits. No mass
or hydronephrosis visualized.

Abdominal aorta

No aneurysm visualized.
IMPRESSION: Fatty infiltration of the liver. Minimally dilated pancreatic duct
as described above. No other abnormalities seen.

## 2014-06-16 ENCOUNTER — Ambulatory Visit (INDEPENDENT_AMBULATORY_CARE_PROVIDER_SITE_OTHER): Payer: BC Managed Care – PPO | Admitting: Diagnostic Neuroimaging

## 2014-06-16 ENCOUNTER — Encounter: Payer: Self-pay | Admitting: Diagnostic Neuroimaging

## 2014-06-16 VITALS — BP 143/86 | HR 91 | Ht 67.0 in | Wt 220.0 lb

## 2014-06-16 DIAGNOSIS — G514 Facial myokymia: Secondary | ICD-10-CM

## 2014-06-16 DIAGNOSIS — G43909 Migraine, unspecified, not intractable, without status migrainosus: Secondary | ICD-10-CM

## 2014-06-16 DIAGNOSIS — R253 Fasciculation: Secondary | ICD-10-CM

## 2014-06-16 DIAGNOSIS — G43109 Migraine with aura, not intractable, without status migrainosus: Secondary | ICD-10-CM

## 2014-06-16 DIAGNOSIS — R258 Other abnormal involuntary movements: Secondary | ICD-10-CM

## 2014-06-16 NOTE — Patient Instructions (Signed)
I will check EEG.   

## 2014-06-16 NOTE — Progress Notes (Signed)
[facility]  PATIENT: Krystal Jackson DOB: [birth year]  REFERRING CLINICIAN:  HISTORY FROM: patient REASON FOR VISIT: follow up   HISTORICAL  CHIEF COMPLAINT:  Chief Complaint  Patient presents with  . Follow-up    HISTORY OF PRESENT ILLNESS:   UPDATE 06/16/14: Since last visit, was doing well, until last month when she was having headaches x 2-3 days (+ N, - V, + photo) then had left arm/hand twitching --> left face twitching. These lasted 1-2 days. Since then having mild word finding diff. Had MRI brain at [facility] ordered by PCP, apparently normal per patient. Now back to normal. Patient reports h/o meningitis in 2005. Has migraine type headaches 1-2 days per month. Also daughter has complicated migraine vs seizure. Also has had sleep study evaluation which has been unremarkable.  UPDATE 04/30/13: Since last visit patient doing well. No recurrent symptoms. Patient told me that she was diagnosed with possible neuropathy in the feet, related to long-standing intermittent bilateral lateral foot numbness and tingling. Patient is taking gabapentin with mild relief. Overall cholesterol and blood pressure under good control. Patient is concerned about continuing on hormone replacement therapy (Femring) and has asked me to contact her OB/GYN to further discuss. Patient also tells me that her sister recently passed away from heart related complications related to a "blood clot in the heart". Patient tells me that she herself has been having intermittent chest pressure sensation and exertional chest pain, and is being worked up by primary care physician currently. She has not seen cardiology.  PRIOR HPI (10/11/12): 48 year old right-handed female with hypertension, hypercholesteremia, here for evaluation of hospital TIA. On 10/01/12 patient had sudden onset of left face and left arm and numbness. She had some weakness as well. Patient went to PCP who ordered  MRI of the brain, which was normal. Patient symptoms persisted and slightly worsened on 10/04/12. Patient continues to have some numbness in her left face and left arm. She has intermittent episodes of feeling like her speech is slurred. She has some word finding difficulties. She also has had some left eyelid twitching for the past 2-3 weeks.   REVIEW OF SYSTEMS: Full 14 system review of systems performed and notable only for: eye pasin eye discharge snoring.    ALLERGIES: Allergies  Allergen Reactions  . Gabapentin Hives    HOME MEDICATIONS: Outpatient Prescriptions Prior to Visit  Medication Sig Dispense Refill  . aspirin EC 325 MG tablet Take 1 tablet by mouth daily.    Marland Kitchen FEMRING 0.1 MG/24HR RING daily.    Marland Kitchen KLOR-CON 10 10 MEQ tablet Take 1 tablet by mouth daily.     . rosuvastatin (CRESTOR) 10 MG tablet Take 10 mg by mouth daily.    . Vitamin D, Ergocalciferol, (DRISDOL) 50000 UNITS CAPS Take 50,000 Units by mouth 2 (two) times a week.     . furosemide (LASIX) 40 MG tablet Take 1 tablet by mouth daily.    . naproxen (NAPROSYN) 500 MG tablet Take 1 tablet by mouth as needed.    . diltiazem (DILACOR XR) 120 MG 24 hr capsule Take 1 capsule by mouth every 4 (four) hours as needed.    . hydrochlorothiazide (HYDRODIURIL) 25 MG tablet daily.     No facility-administered medications prior to visit.    PAST MEDICAL HISTORY: Past Medical History  Diagnosis Date  . Heart murmur   . Hypokalemia   . Vitamin D deficiency   . Hypertension   . Hypercholesteremia  PAST SURGICAL HISTORY: Past Surgical History  Procedure Laterality Date  . Knee surgery  2010  . Vaginal hysterectomy  2000    FAMILY HISTORY: Family History  Problem Relation Age of Onset  . Other Father   . Colon cancer    . Skin cancer    . Stroke    . High Cholesterol      SOCIAL HISTORY:  History   Social History  . Marital Status: Married    Spouse Name: [name]    Number of Children: 2  . Years of  Education: MAED   Occupational History  . Teacher [employer]  . [employer]   Social History Main Topics  . Smoking status: Never Smoker   . Smokeless tobacco: Never Used  . Alcohol Use: No  . Drug Use: No  . Sexual Activity: Not on file   Other Topics Concern  . Not on file   Social History Narrative      Pt lives at home with her spouse [name]) and her daughter.   Patient has two children.   Caffeine Use- 16oz(s) daily   Patient has a Masters degree.   Patient is right- handed.   Patient drinks 16 oz. Of caffeine daily.     PHYSICAL EXAM  Filed Vitals:   06/16/14 0904  BP: 143/86  Pulse: 91  Height: $Remove'5\' 7"'YoZGugD$  (1.702 m)  Weight: 220 lb (99.791 kg)   Body mass index is 34.45 kg/(m^2).  GENERAL EXAM: Patient is in no distress  CARDIOVASCULAR: Regular rate and rhythm, no murmurs, no carotid bruits  NEUROLOGIC: MENTAL STATUS: awake, alert, language fluent, comprehension intact, naming intact CRANIAL NERVE: pupils equal and reactive to light, visual fields full to confrontation, extraocular muscles intact, no nystagmus, facial sensation and strength symmetric, uvula midline, shoulder shrug symmetric, tongue midline. MOTOR: normal bulk and tone, full strength in the BUE, BLE SENSORY: normal and symmetric to light touch, temperature, vibration COORDINATION: finger-nose-finger, fine finger movements normal REFLEXES: deep tendon reflexes present and symmetric GAIT/STATION: narrow based gait; romberg is negative    DIAGNOSTIC DATA (LABS, IMAGING, TESTING) - I reviewed patient records, labs, notes, testing and imaging myself where available.  Lab Results  Component Value Date   WBC 8.2 06/21/2010   HGB 12.1 06/21/2010   HCT 36.6 06/21/2010   MCV 85.1 06/21/2010   PLT 304 06/21/2010      Component Value Date/Time   NA 139 06/21/2010 0450   K 3.1* 06/21/2010 0450   CL 105 06/21/2010 0450   CO2 27 06/21/2010 0450    GLUCOSE 95 06/21/2010 0450   BUN 8 06/21/2010 0450   CREATININE 0.80 06/21/2010 0450   CALCIUM 8.5 06/21/2010 0450   PROT 6.1 06/21/2010 0450   ALBUMIN 3.4* 06/21/2010 0450   AST 15 06/21/2010 0450   ALT 12 06/21/2010 0450   ALKPHOS 80 06/21/2010 0450   BILITOT 0.7 06/21/2010 0450   GFRNONAA >60 06/21/2010 0450   GFRAA  06/21/2010 0450    >60        The eGFR has been calculated using the MDRD equation. This calculation has not been validated in all clinical situations. eGFR's persistently <60 mL/min signify possible Chronic Kidney Disease.   Lab Results  Component Value Date   CHOL  06/21/2010    187        ATP III CLASSIFICATION:  <200     mg/dL   Desirable  200-239  mg/dL  Borderline High  >=240    mg/dL   High          HDL 50 06/21/2010   LDLCALC * 06/21/2010    121        Total Cholesterol/HDL:CHD Risk Coronary Heart Disease Risk Table                     Men   Women  1/2 Average Risk   3.4   3.3  Average Risk       5.0   4.4  2 X Average Risk   9.6   7.1  3 X Average Risk  23.4   11.0        Use the calculated Patient Ratio above and the CHD Risk Table to determine the patient's CHD Risk.        ATP III CLASSIFICATION (LDL):  <100     mg/dL   Optimal  100-129  mg/dL   Near or Above                    Optimal  130-159  mg/dL   Borderline  160-189  mg/dL   High  >190     mg/dL   Very High   TRIG 78 06/21/2010   CHOLHDL 3.7 06/21/2010   Lab Results  Component Value Date   HGBA1C * 06/21/2010    6.0 (NOTE)                                                                       According to the ADA Clinical Practice Recommendations for 2011, when HbA1c is used as a screening test:   >=6.5%   Diagnostic of Diabetes Mellitus           (if abnormal result  is confirmed)  5.7-6.4%   Increased risk of developing Diabetes Mellitus  References:Diagnosis and Classification of Diabetes Mellitus,Diabetes PZWC,5852,77(OEUMP 1):S62-S69 and Standards of Medical Care in          Diabetes - 2011,Diabetes Care,2011,34  (Suppl 1):S11-S61.   No results found for: VITAMINB12 Lab Results  Component Value Date   TSH 2.866 06/21/2010    10/01/12 MRI brain - no acute findings; normal  10/17/12 MRI brain - normal  10/17/12 MRA head - normal  10/17/12 MRA neck - normal    ASSESSMENT AND PLAN  48 y.o. year old female  has a past medical history of Heart murmur; Hypokalemia; Vitamin D deficiency; Hypertension; and Hypercholesteremia. here with a new episode of left face/arm twitching, headache, and word finding difficulty. MRI brain x 3 have been normal. MRA head / neck last year unremarkable. These episodes unlikely to be TIA or stroke related. I think complicated migraine or partial seizure would be better explanations.  Ddx: complicated acephalgic migraine vs partial seizure  PLAN: 1. Continue aspirin, blood pressure control, crestor 2. EEG 3. May consider topiramate in the future, pending EEG results and pattern of future events   Return in about 3 months (around 09/15/2014).   Penni Bombard, MD 53/61/4431, 5:40 AM Certified in Neurology, Neurophysiology and Neuroimaging  [facility] [address] [place], [place] [address] [phone]

## 2014-06-23 ENCOUNTER — Other Ambulatory Visit: Payer: BC Managed Care – PPO | Admitting: Radiology

## 2014-07-20 ENCOUNTER — Other Ambulatory Visit (HOSPITAL_BASED_OUTPATIENT_CLINIC_OR_DEPARTMENT_OTHER): Payer: Self-pay | Admitting: Internal Medicine

## 2014-07-20 DIAGNOSIS — G458 Other transient cerebral ischemic attacks and related syndromes: Secondary | ICD-10-CM

## 2014-09-14 ENCOUNTER — Ambulatory Visit: Payer: BC Managed Care – PPO | Admitting: Diagnostic Neuroimaging

## 2014-09-15 ENCOUNTER — Encounter: Payer: Self-pay | Admitting: Diagnostic Neuroimaging

## 2019-08-21 ENCOUNTER — Other Ambulatory Visit (HOSPITAL_BASED_OUTPATIENT_CLINIC_OR_DEPARTMENT_OTHER): Payer: Self-pay | Admitting: Internal Medicine

## 2019-08-21 ENCOUNTER — Other Ambulatory Visit: Payer: Self-pay | Admitting: Internal Medicine

## 2019-08-21 DIAGNOSIS — R221 Localized swelling, mass and lump, neck: Secondary | ICD-10-CM

## 2019-08-21 DIAGNOSIS — M542 Cervicalgia: Secondary | ICD-10-CM

## 2019-08-25 ENCOUNTER — Ambulatory Visit (INDEPENDENT_AMBULATORY_CARE_PROVIDER_SITE_OTHER): Payer: BC Managed Care – PPO

## 2019-08-25 ENCOUNTER — Other Ambulatory Visit: Payer: Self-pay

## 2019-08-25 DIAGNOSIS — M542 Cervicalgia: Secondary | ICD-10-CM

## 2019-08-25 DIAGNOSIS — R221 Localized swelling, mass and lump, neck: Secondary | ICD-10-CM

## 2019-08-25 MED ORDER — IOHEXOL 300 MG/ML  SOLN
75.0000 mL | Freq: Once | INTRAMUSCULAR | Status: AC | PRN
  Administered 2019-08-25: 75 mL via INTRAVENOUS

## 2019-08-26 ENCOUNTER — Ambulatory Visit (HOSPITAL_BASED_OUTPATIENT_CLINIC_OR_DEPARTMENT_OTHER): Payer: BC Managed Care – PPO

## 2019-12-02 ENCOUNTER — Other Ambulatory Visit (HOSPITAL_COMMUNITY): Payer: Self-pay | Admitting: Physician Assistant

## 2019-12-02 ENCOUNTER — Other Ambulatory Visit: Payer: Self-pay

## 2019-12-02 ENCOUNTER — Ambulatory Visit (HOSPITAL_COMMUNITY)
Admission: RE | Admit: 2019-12-02 | Discharge: 2019-12-02 | Disposition: A | Discharge status: Home / Self Care | Payer: BC Managed Care – PPO | Source: Ambulatory Visit | Attending: Surgery | Admitting: Surgery

## 2019-12-02 DIAGNOSIS — M7989 Other specified soft tissue disorders: Secondary | ICD-10-CM

## 2021-07-13 ENCOUNTER — Other Ambulatory Visit: Payer: Self-pay

## 2021-07-13 ENCOUNTER — Encounter: Payer: Self-pay | Admitting: Cardiology

## 2021-07-13 ENCOUNTER — Ambulatory Visit: Payer: BC Managed Care – PPO | Admitting: Cardiology

## 2021-07-13 VITALS — BP 138/72 | HR 78 | Temp 98.0°F | Resp 16 | Ht 67.0 in | Wt 241.0 lb

## 2021-07-13 DIAGNOSIS — Z8673 Personal history of transient ischemic attack (TIA), and cerebral infarction without residual deficits: Secondary | ICD-10-CM | POA: Insufficient documentation

## 2021-07-13 DIAGNOSIS — R0609 Other forms of dyspnea: Secondary | ICD-10-CM | POA: Insufficient documentation

## 2021-07-13 DIAGNOSIS — D6852 Prothrombin gene mutation: Secondary | ICD-10-CM | POA: Insufficient documentation

## 2021-07-13 NOTE — Progress Notes (Signed)
Patient referred by Benito Mccreedy, MD for dyspnea on exertion  Subjective:   Krystal Jackson, female    DOB: 06-09-1966, 56 y.o.   MRN: [id number]   Chief Complaint  Patient presents with   Shortness of Breath   New Patient (Initial Visit)     HPI  56 y.o. 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  She is here in [place] for a few physician visits.  She saw her PCP Dr. Vista Lawman today, and has an appointment with rheumatology at [facility] for symptoms of myalgias and arthralgias.  Dr. Vista Lawman requested me to see her today given her symptoms of dyspnea on exertion.  Patient is recently experienced dyspnea on exertion with activity such as climbing up stairs.  She denies any chest pain.  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  In 2013, she had an episode of TIA without significant abnormalities on MRI/MRA.  Patient's younger sister, also with similar gene mutation, had some sort of an embolic event that led to prolonged hospitalization and eventual demise.  Patient's father, also had similar gene mutation and died early.    Past Medical History:  Diagnosis Date   Heart murmur    Hypercholesteremia    Hypertension    Hypokalemia    Vitamin D deficiency      Past Surgical History:  Procedure Laterality Date   KNEE SURGERY  2010   VAGINAL HYSTERECTOMY  2000     Social History   Tobacco Use  Smoking Status Never  Smokeless Tobacco Never    Social History   Substance and Sexual Activity  Alcohol Use No     Family History  Problem Relation Age of Onset   Other Father    Colon cancer Other    Skin cancer Other    Stroke Other    High Cholesterol Other      Current Outpatient Medications on File Prior to Visit  Medication Sig Dispense Refill    aspirin 325 MG tablet Take by mouth.     Butalbital-APAP-Caffeine 50-300-40 MG CAPS Take 1 capsule by mouth every 4 (four) hours.     cetirizine-pseudoephedrine (ZYRTEC-D) 5-120 MG tablet as needed.     diclofenac (VOLTAREN) 75 MG EC tablet Take 75 mg by mouth 2 (two) times daily.     diltiazem (TIAZAC) 120 MG 24 hr capsule Take 240 mg by mouth daily.     FEMRING 0.1 MG/24HR RING daily.     furosemide (LASIX) 40 MG tablet Take 1 tablet by mouth daily.     naproxen (NAPROSYN) 500 MG tablet Take 1 tablet by mouth as needed.     omeprazole (PRILOSEC) 40 MG capsule as needed.     predniSONE (DELTASONE) 20 MG tablet Take 40 mg by mouth daily as needed.     PROAIR HFA 108 (90 BASE) MCG/ACT inhaler as needed.  0   rosuvastatin (CRESTOR) 10 MG tablet Take 10 mg by mouth daily.     SYNTHROID 75 MCG tablet Take 75 mcg by mouth daily.     No current facility-administered medications on file prior to visit.    Cardiovascular and other pertinent studies:  EKG 07/13/2021: Sinus rhythm 80 bpm  Normal EKG  Echocardiogram 09/15/2019: The left ventricular size is normal.  There is mild concentric left ventricular hypertrophy.  Left ventricular systolic  function is normal.  LV ejection fraction = 60-65%.  Left ventricular filling pattern is prolonged relaxation.  The right ventricle is normal in size and function.  Normal atrial size.  No significant valvular stenosis or regurgitation.  The IVC is normal in size with an inspiratory collapse of greater then  50%, suggesting normal right atrial pressure.  There is no pericardial effusion.  Compared with prior study (08/06/14), probably no significant change.   MRI brain/MRA head and neck 2014: Normal MRI scan of the brain.  Normal MRA of the brain without significant stenosis  of the medium to large size intracranial vessels .    Recent labs: Not available   Review of Systems  Cardiovascular:  Positive for dyspnea on exertion. Negative for  chest pain, leg swelling, palpitations and syncope.  Musculoskeletal:  Positive for joint pain and myalgias.        Vitals:   07/13/21 1446  BP: 138/72  Pulse: 78  Resp: 16  Temp: 98 F (36.7 C)  SpO2: 96%     Body mass index is 37.75 kg/m. Filed Weights   07/13/21 1446  Weight: 241 lb (109.3 kg)     Objective:   Physical Exam Vitals and nursing note reviewed.  Constitutional:      General: She is not in acute distress. Neck:     Vascular: No JVD.  Cardiovascular:     Rate and Rhythm: Normal rate and regular rhythm.     Heart sounds: Normal heart sounds. No murmur heard. Pulmonary:     Effort: Pulmonary effort is normal.     Breath sounds: Normal breath sounds. No wheezing or rales.  Musculoskeletal:     Right lower leg: No edema.     Left lower leg: No edema.        Assessment & Recommendations:   56 y.o. 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 Will obtain echocardiogram. PE also remains on differential, although she looks hemodynamically stable without any tachycardia or hypoxia. Will check D-dimer. If severely elevated, will recommend stat CTA at emergency room. If mild elevated, could arrange for outpatient stat CTA in the coming week. Also recommend exercise treadmill stress test.  H/o TIA: Currently on aspirin 325 mg daily, Lipitor 10 mg daily. Pain management from PCP. Given history of TIA, may be reasonable to continue aspirin 325 mg daily, in absence of any bleeding.  Further recommendations after above testing.  Thank you for referring the patient to [place]. Please feel free to contact with any questions.   Nigel Mormon, MD Pager: [phone] Office: [phone]

## 2021-07-14 LAB — D-DIMER, QUANTITATIVE: D-DIMER: 0.48 mg/L FEU (ref 0.00–0.49)

## 2021-07-19 ENCOUNTER — Ambulatory Visit: Payer: BC Managed Care – PPO

## 2021-07-19 ENCOUNTER — Other Ambulatory Visit: Payer: Self-pay

## 2021-07-19 DIAGNOSIS — R0609 Other forms of dyspnea: Secondary | ICD-10-CM

## 2021-08-30 ENCOUNTER — Other Ambulatory Visit: Payer: Self-pay

## 2021-08-30 ENCOUNTER — Ambulatory Visit: Payer: BC Managed Care – PPO

## 2021-08-30 DIAGNOSIS — R0609 Other forms of dyspnea: Secondary | ICD-10-CM

## 2022-06-29 ENCOUNTER — Telehealth: Payer: BC Managed Care – PPO | Admitting: Physician Assistant

## 2022-06-29 DIAGNOSIS — J069 Acute upper respiratory infection, unspecified: Secondary | ICD-10-CM

## 2022-06-30 NOTE — Progress Notes (Signed)
Thank you for submitting your e-visit. We are sorry that you are not feeling well! On review of your submission, you have either listed that you are in a state outside our coverage area or have listed a pharmacy outside that area. Our providers are licensed in [place] and [place], and cannot treat or send a medication to a patient outside that coverage area due to telehealth laws.   In order to make sure you are taken care of today, there are a few options:  (1) Contact your PCP to see if they can see you via video and send the medication in for you (2) You can click HERE to be taken to our [place] [facility] site to be paired with a provider licensed in your area. When signing up for a visit, you need to select the state you are currently in so you will be able to select the proper providers (3) Be seen locally at an urgent care facility.   Thank you again for choosing [facility] for your care needs. We hope you feel better soon!  

## 2022-07-26 ENCOUNTER — Ambulatory Visit: Payer: BC Managed Care – PPO | Admitting: Cardiology

## 2022-08-14 ENCOUNTER — Ambulatory Visit: Payer: BC Managed Care – PPO | Admitting: Cardiology

## 2022-08-14 ENCOUNTER — Encounter: Payer: Self-pay | Admitting: Cardiology

## 2022-08-14 VITALS — BP 139/73 | HR 74 | Ht 67.0 in | Wt 229.2 lb

## 2022-08-14 DIAGNOSIS — E782 Mixed hyperlipidemia: Secondary | ICD-10-CM

## 2022-08-14 DIAGNOSIS — Z01810 Encounter for preprocedural cardiovascular examination: Secondary | ICD-10-CM

## 2022-08-14 DIAGNOSIS — I10 Essential (primary) hypertension: Secondary | ICD-10-CM | POA: Insufficient documentation

## 2022-08-14 NOTE — Progress Notes (Signed)
Patient referred by Benito Mccreedy, MD for dyspnea on exertion  Subjective:   Krystal Jackson, female    DOB: 05-Dec-1965, 57 y.o.   MRN: [id number]   Chief Complaint  Patient presents with   Essential hypertension   Dyspnea on exertion   Medical Clearance     HPI  57 y.o. 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  She is going to undergo surgery on her left knee in the near future.  She recently noticed increasing pain in her left knee while walking long distances in [facility].  At that level of activity, she denies any chest pain or shortness of breath symptoms.   Initial consultation visit 07/2021: Patient currently lives in [place], [place].  She is here in [place] for a few physician visits.  She saw her PCP Dr. Vista Lawman today, and has an appointment with rheumatology at [facility] for symptoms of myalgias and arthralgias.  Dr. Vista Lawman requested me to see her today given her symptoms of dyspnea on exertion.  Patient is recently experienced dyspnea on exertion with activity such as climbing up stairs.  She denies any chest pain.  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  In 2013, she had an episode of TIA without significant abnormalities on MRI/MRA.  Patient's younger sister, also with similar gene mutation, had some sort of an embolic event that led to prolonged hospitalization and eventual demise.  Patient's father, also had similar gene mutation and died early.     Current Outpatient Medications:    aspirin 325 MG tablet, Take by mouth., Disp: , Rfl:    cetirizine-pseudoephedrine (ZYRTEC-D) 5-120 MG tablet, as needed., Disp: , Rfl:    diclofenac (VOLTAREN) 75 MG EC tablet, Take 75 mg by mouth 2 (two) times daily., Disp: , Rfl:    diltiazem (TIAZAC) 120 MG 24 hr capsule, Take 240 mg  by mouth daily., Disp: , Rfl:    FEMRING 0.1 MG/24HR RING, daily., Disp: , Rfl:    furosemide (LASIX) 40 MG tablet, Take 1 tablet by mouth as needed., Disp: , Rfl:    naproxen (NAPROSYN) 500 MG tablet, Take 1 tablet by mouth as needed., Disp: , Rfl:    omeprazole (PRILOSEC) 40 MG capsule, as needed., Disp: , Rfl:    PROAIR HFA 108 (90 BASE) MCG/ACT inhaler, as needed., Disp: , Rfl: 0   rosuvastatin (CRESTOR) 10 MG tablet, Take 10 mg by mouth daily., Disp: , Rfl:    SYNTHROID 75 MCG tablet, Take 75 mcg by mouth daily., Disp: , Rfl:   Cardiovascular and other pertinent studies:  EKG 08/14/2022: Sinus rhythm 68 bpm  IVCD Cannot exclude old inferior infarct  Exercise treadmill stress test 08/30/2021: Exercise treadmill stress test performed using Bruce protocol.  Patient reached 7 METS, and 87% of age predicted maximum heart rate.  Exercise capacity was low. No chest pain reported.  Normal heart rate response. Resting hypertension 160/90 mmHg with normal exercise response.  Peak ECG demonstrated sinus tachycardia, <23m upsloping St depression inferolateral leads. These changes are negative for ischemia. No arrhythmia noted.  Low risk study.   Echocardiogram 07/19/2021: Normal LV systolic function with visual EF 60-65%. Left ventricle cavity is normal in size. Moderate left ventricular hypertrophy. Normal diastolic filling pattern, normal LAP. No significant valvular heart disease. Compared to study 09/15/2019 no significant change.  MRI brain/MRA head and neck 2014: Normal MRI scan of the brain.  Normal MRA of the brain without significant stenosis  of the medium to large size intracranial vessels .    Recent labs: Not available   Review of Systems  Cardiovascular:  Negative for chest pain, dyspnea on exertion, leg swelling, palpitations and syncope.  Musculoskeletal:  Positive for joint pain. Negative for myalgias.         Vitals:   08/14/22 1416  BP: 139/73  Pulse: 74   SpO2: 97%    Body mass index is 35.9 kg/m. Filed Weights   08/14/22 1416  Weight: 229 lb 3.2 oz (104 kg)     Objective:   Physical Exam Vitals and nursing note reviewed.  Constitutional:      General: She is not in acute distress. Neck:     Vascular: No JVD.  Cardiovascular:     Rate and Rhythm: Normal rate and regular rhythm.     Heart sounds: Murmur heard.     High-pitched blowing holosystolic murmur is present with a grade of 1/6 at the apex.  Pulmonary:     Effort: Pulmonary effort is normal.     Breath sounds: Normal breath sounds. No wheezing or rales.  Musculoskeletal:     Right lower leg: No edema.     Left lower leg: No edema.         Assessment & Recommendations:   57 y.o. 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 Murmur likely functional in nature. Low perioperative cardiac risk for upcoming knee surgery.  H/o TIA: Currently on aspirin 325 mg daily, Lipitor 10 mg daily. Given history of TIA, may be reasonable to continue aspirin 325 mg daily, in absence of any bleeding.  F/u as needed    Nigel Mormon, MD Pager: [phone] Office: [phone]

## 2022-08-16 ENCOUNTER — Ambulatory Visit: Payer: BC Managed Care – PPO | Admitting: Cardiology

## 2023-05-15 ENCOUNTER — Encounter (INDEPENDENT_AMBULATORY_CARE_PROVIDER_SITE_OTHER): Payer: Self-pay | Admitting: Otolaryngology

## 2023-05-22 ENCOUNTER — Encounter (INDEPENDENT_AMBULATORY_CARE_PROVIDER_SITE_OTHER): Payer: Self-pay | Admitting: Otolaryngology

## 2024-06-10 ENCOUNTER — Encounter (INDEPENDENT_AMBULATORY_CARE_PROVIDER_SITE_OTHER): Payer: Self-pay
